# Patient Record
Sex: Female | Born: 1940 | Race: White | Hispanic: No | Smoking: Never smoker
Health system: Southern US, Community
[De-identification: ages and names within clinical notes are randomized; demographics above are authoritative.]

## PROBLEM LIST (undated history)

## (undated) DIAGNOSIS — I10 Essential (primary) hypertension: Secondary | ICD-10-CM

## (undated) DIAGNOSIS — M8580 Other specified disorders of bone density and structure, unspecified site: Secondary | ICD-10-CM

## (undated) DIAGNOSIS — E7800 Pure hypercholesterolemia, unspecified: Secondary | ICD-10-CM

## (undated) DIAGNOSIS — E559 Vitamin D deficiency, unspecified: Secondary | ICD-10-CM

## (undated) DIAGNOSIS — N189 Chronic kidney disease, unspecified: Secondary | ICD-10-CM

## (undated) DIAGNOSIS — M7030 Other bursitis of elbow, unspecified elbow: Secondary | ICD-10-CM

## (undated) DIAGNOSIS — M1990 Unspecified osteoarthritis, unspecified site: Secondary | ICD-10-CM

## (undated) DIAGNOSIS — M5126 Other intervertebral disc displacement, lumbar region: Secondary | ICD-10-CM

## (undated) HISTORY — PX: OTHER SURGICAL HISTORY: SHX169

## (undated) HISTORY — PX: APPENDECTOMY: SHX54

## (undated) HISTORY — PX: CHOLECYSTECTOMY: SHX55

## (undated) HISTORY — PX: ABDOMINAL HYSTERECTOMY: SHX81

## (undated) HISTORY — PX: FOOT SURGERY: SHX648

## (undated) HISTORY — PX: INCISE AND DRAIN ABCESS: PRO64

## (undated) HISTORY — PX: JOINT REPLACEMENT: SHX530

---

## 2009-12-01 ENCOUNTER — Emergency Department: Payer: Self-pay | Admitting: Emergency Medicine

## 2010-05-18 ENCOUNTER — Emergency Department: Payer: Self-pay | Admitting: Emergency Medicine

## 2010-05-20 ENCOUNTER — Inpatient Hospital Stay: Payer: Self-pay | Admitting: Orthopedic Surgery

## 2010-05-29 ENCOUNTER — Encounter: Payer: Self-pay | Admitting: Physician Assistant

## 2010-06-08 ENCOUNTER — Encounter: Payer: Self-pay | Admitting: Physician Assistant

## 2010-07-09 ENCOUNTER — Encounter: Payer: Self-pay | Admitting: Physician Assistant

## 2011-11-05 ENCOUNTER — Ambulatory Visit: Payer: Self-pay | Admitting: Podiatry

## 2013-10-11 ENCOUNTER — Ambulatory Visit: Payer: Self-pay | Admitting: Neurology

## 2015-02-15 ENCOUNTER — Encounter: Payer: Self-pay | Admitting: *Deleted

## 2015-02-18 ENCOUNTER — Ambulatory Visit
Admission: RE | Admit: 2015-02-18 | Discharge: 2015-02-18 | Disposition: A | Discharge status: Home / Self Care | Payer: Medicare Other | Source: Ambulatory Visit | Attending: Gastroenterology | Admitting: Gastroenterology

## 2015-02-18 ENCOUNTER — Ambulatory Visit: Payer: Medicare Other | Admitting: Anesthesiology

## 2015-02-18 ENCOUNTER — Encounter
Admission: RE | Disposition: A | Discharge status: Home / Self Care | Payer: Self-pay | Source: Ambulatory Visit | Attending: Gastroenterology

## 2015-02-18 ENCOUNTER — Encounter: Payer: Self-pay | Admitting: Anesthesiology

## 2015-02-18 DIAGNOSIS — I10 Essential (primary) hypertension: Secondary | ICD-10-CM | POA: Diagnosis not present

## 2015-02-18 DIAGNOSIS — Z1211 Encounter for screening for malignant neoplasm of colon: Secondary | ICD-10-CM | POA: Diagnosis not present

## 2015-02-18 DIAGNOSIS — K5730 Diverticulosis of large intestine without perforation or abscess without bleeding: Secondary | ICD-10-CM | POA: Insufficient documentation

## 2015-02-18 DIAGNOSIS — E559 Vitamin D deficiency, unspecified: Secondary | ICD-10-CM | POA: Diagnosis not present

## 2015-02-18 DIAGNOSIS — M8580 Other specified disorders of bone density and structure, unspecified site: Secondary | ICD-10-CM | POA: Diagnosis not present

## 2015-02-18 DIAGNOSIS — D123 Benign neoplasm of transverse colon: Secondary | ICD-10-CM | POA: Insufficient documentation

## 2015-02-18 DIAGNOSIS — K644 Residual hemorrhoidal skin tags: Secondary | ICD-10-CM | POA: Diagnosis not present

## 2015-02-18 DIAGNOSIS — E780 Pure hypercholesterolemia: Secondary | ICD-10-CM | POA: Diagnosis not present

## 2015-02-18 DIAGNOSIS — Z96641 Presence of right artificial hip joint: Secondary | ICD-10-CM | POA: Diagnosis not present

## 2015-02-18 HISTORY — DX: Vitamin D deficiency, unspecified: E55.9

## 2015-02-18 HISTORY — DX: Pure hypercholesterolemia, unspecified: E78.00

## 2015-02-18 HISTORY — DX: Other bursitis of elbow, unspecified elbow: M70.30

## 2015-02-18 HISTORY — DX: Essential (primary) hypertension: I10

## 2015-02-18 HISTORY — PX: COLONOSCOPY WITH PROPOFOL: SHX5780

## 2015-02-18 HISTORY — DX: Other specified disorders of bone density and structure, unspecified site: M85.80

## 2015-02-18 SURGERY — COLONOSCOPY WITH PROPOFOL
Anesthesia: General

## 2015-02-18 MED ORDER — PROPOFOL INFUSION 10 MG/ML OPTIME
INTRAVENOUS | Status: DC | PRN
  Administered 2015-02-18: 120 ug/kg/min via INTRAVENOUS

## 2015-02-18 MED ORDER — LIDOCAINE HCL (CARDIAC) 20 MG/ML IV SOLN
INTRAVENOUS | Status: DC | PRN
  Administered 2015-02-18: 100 mg via INTRAVENOUS

## 2015-02-18 MED ORDER — SODIUM CHLORIDE 0.9 % IV SOLN
INTRAVENOUS | Status: DC
  Administered 2015-02-18: 1000 mL via INTRAVENOUS

## 2015-02-18 MED ORDER — GLYCOPYRROLATE 0.2 MG/ML IJ SOLN
INTRAMUSCULAR | Status: DC | PRN
  Administered 2015-02-18 (×2): 0.2 mg via INTRAVENOUS

## 2015-02-18 MED ORDER — SODIUM CHLORIDE 0.9 % IV SOLN: INTRAVENOUS | Status: DC

## 2015-02-18 MED ORDER — PROPOFOL 10 MG/ML IV BOLUS
INTRAVENOUS | Status: DC | PRN
  Administered 2015-02-18: 60 mg via INTRAVENOUS

## 2015-02-18 MED ORDER — IPRATROPIUM-ALBUTEROL 0.5-2.5 (3) MG/3ML IN SOLN
RESPIRATORY_TRACT | Status: AC
  Filled 2015-02-18: qty 3

## 2015-02-18 NOTE — Anesthesia Preprocedure Evaluation (Signed)
Anesthesia Evaluation  Patient identified by MRN, date of birth, ID band Patient awake    Reviewed: Allergy & Precautions, H&P , NPO status , Patient's Chart, lab work & pertinent test results  Airway Mallampati: III  TM Distance: >3 FB Neck ROM: limited    Dental no notable dental hx. (+) Teeth Intact   Pulmonary neg pulmonary ROS,    Pulmonary exam normal breath sounds clear to auscultation       Cardiovascular Exercise Tolerance: Good hypertension, (-) Past MI Normal cardiovascular exam Rhythm:regular Rate:Normal     Neuro/Psych negative neurological ROS  negative psych ROS   GI/Hepatic negative GI ROS, Neg liver ROS, neg GERD  ,  Endo/Other  negative endocrine ROS  Renal/GU negative Renal ROS  negative genitourinary   Musculoskeletal   Abdominal   Peds  Hematology negative hematology ROS (+)   Anesthesia Other Findings Past Medical History:   Hypertension                                                 Epitrochlear bursitis                                        Osteopenia                                                   Vitamin D deficiency                                         Hypercholesterolemia                                         Reproductive/Obstetrics negative OB ROS                             Anesthesia Physical Anesthesia Plan  ASA: III  Anesthesia Plan: General   Post-op Pain Management:    Induction:   Airway Management Planned:   Additional Equipment:   Intra-op Plan:   Post-operative Plan:   Informed Consent: I have reviewed the patients History and Physical, chart, labs and discussed the procedure including the risks, benefits and alternatives for the proposed anesthesia with the patient or authorized representative who has indicated his/her understanding and acceptance.   Dental Advisory Given  Plan Discussed with: Anesthesiologist, CRNA and  Surgeon  Anesthesia Plan Comments:         Anesthesia Quick Evaluation

## 2015-02-18 NOTE — Anesthesia Postprocedure Evaluation (Signed)
  Anesthesia Post-op Note  Patient: Sabrina Schmidt  Procedure(s) Performed: Procedure(s): COLONOSCOPY WITH PROPOFOL (N/A)  Anesthesia type:General  Patient location: PACU  Post pain: Pain level controlled  Post assessment: Post-op Vital signs reviewed, Patient's Cardiovascular Status Stable, Respiratory Function Stable, Patent Airway and No signs of Nausea or vomiting  Post vital signs: Reviewed and stable  Last Vitals:  Filed Vitals:   02/18/15 1251  BP: 106/84  Pulse: 87  Temp:   Resp: 16    Level of consciousness: awake, alert  and patient cooperative  Complications: No apparent anesthesia complications

## 2015-02-18 NOTE — Discharge Instructions (Signed)

## 2015-02-18 NOTE — Op Note (Signed)
[facility] Gastroenterology Patient Name: Sabrina Schmidt Procedure Date: 02/18/2015 11:36 AM MRN: [id number] Account #: [id number] Date of Birth: 07/26/1940 Admit Type: Outpatient Age: 74 Room: [facility] ENDO ROOM 2 Gender: Female Note Status: Finalized Procedure:         Colonoscopy Indications:       Screening for colorectal malignant neoplasm, Last                     colonoscopy: 2008 Patient Profile:   This is a 74 year old female. Providers:         Gerrit Heck. Rayann Heman, MD Referring MD:      Dani Gobble. White, MD (Referring MD) Medicines:         Propofol per Anesthesia Complications:     No immediate complications. Procedure:         Pre-Anesthesia Assessment:                    - Prior to the procedure, a History and Physical was                     performed, and patient medications, allergies and                     sensitivities were reviewed. The patient's tolerance of                     previous anesthesia was reviewed.                    After obtaining informed consent, the colonoscope was                     passed under direct vision. Throughout the procedure, the                     patient's blood pressure, pulse, and oxygen saturations                     were monitored continuously. The Olympus PCF-H180AL                     colonoscope ( S#: [id number] ) was introduced through the                     anus and advanced to the the cecum, identified by                     appendiceal orifice and ileocecal valve. The colonoscopy                     was performed without difficulty. The patient tolerated                     the procedure well. The quality of the bowel preparation                     was excellent. Findings:      The perianal exam findings include non-thrombosed external hemorrhoids.      A 6 mm polyp was found in the mid transverse colon. The polyp was       sessile. The polyp was removed with a hot snare. Resection and retrieval   were complete.      Two sessile polyps were found in the mid transverse colon just proximal  to 6 mm polyp. The polyps were 2 to 3 mm in size. These polyps were       removed with a jumbo cold forceps. Resection and retrieval were complete.      Many small and large-mouthed diverticula were found in the sigmoid colon.      The exam was otherwise without abnormality on direct and retroflexion       views. Impression:        - Non-thrombosed external hemorrhoids found on perianal                     exam.                    - One 6 mm polyp in the mid transverse colon. Resected and                     retrieved.                    - Two 2 to 3 mm polyps in the mid transverse colon.                     Resected and retrieved.                    - Diverticulosis in the sigmoid colon.                    - The examination was otherwise normal on direct and                     retroflexion views. Recommendation:    - Observe patient in GI recovery unit.                    - Continue present medications.                    - Await pathology results.                    - Repeat colonoscopy for surveillance based on pathology                     results.                    - The findings and recommendations were discussed with the                     patient.                    - The findings and recommendations were discussed with the                     patient's family. Procedure Code(s): --- Professional ---                    [phone], Colonoscopy, flexible; with removal of tumor(s),                     polyp(s), or other lesion(s) by snare technique                    61607, 62, Colonoscopy, flexible; with biopsy, single or                     multiple CPT copyright  2014 American Medical Association. All rights reserved. The codes documented in this report are preliminary and upon coder review may  be revised to meet current compliance requirements. Mellody Life, MD 02/18/2015  12:20:10 PM This report has been signed electronically. Number of Addenda: 0 Note Initiated On: 02/18/2015 11:36 AM Scope Withdrawal Time: 0 hours 13 minutes 6 seconds  Total Procedure Duration: 0 hours 24 minutes 14 seconds       [facility]

## 2015-02-18 NOTE — Transfer of Care (Signed)
Immediate Anesthesia Transfer of Care Note  Patient: Sabrina Schmidt  Procedure(s) Performed: Procedure(s): COLONOSCOPY WITH PROPOFOL (N/A)  Patient Location: Endoscopy Unit  Anesthesia Type:General  Level of Consciousness: sedated  Airway & Oxygen Therapy: Patient Spontanous Breathing and Patient connected to nasal cannula oxygen  Post-op Assessment: Report given to RN and Post -op Vital signs reviewed and stable  Post vital signs: Reviewed and stable  Last Vitals:  Filed Vitals:   02/18/15 1107  BP: 101/57  Pulse: 70  Temp: 36 C  Resp: 16    Complications: No apparent anesthesia complications

## 2015-02-18 NOTE — H&P (Signed)
  Primary Care Physician:  WHITE, Orlene Och, NP  Pre-Procedure History & Physical: HPI:  Sabrina Schmidt is a 74 y.o. female is here for an colonoscopy.   Past Medical History  Diagnosis Date  . Hypertension   . Epitrochlear bursitis   . Osteopenia   . Vitamin D deficiency   . Hypercholesterolemia     Past Surgical History  Procedure Laterality Date  . Joint replacement    . Rt. hip replacement      Prior to Admission medications   Medication Sig Start Date End Date Taking? Authorizing Provider  hydrochlorothiazide (HYDRODIURIL) 25 MG tablet Take 25 mg by mouth daily.   Yes Historical Provider, MD  atorvastatin (LIPITOR) 40 MG tablet Take 40 mg by mouth daily.    Historical Provider, MD    Allergies as of 01/29/2015  . (Not on File)    History reviewed. No pertinent family history.  Social History   Social History  . Marital Status: Unknown    Spouse Name: N/A  . Number of Children: N/A  . Years of Education: N/A   Occupational History  . Not on file.   Social History Main Topics  . Smoking status: Never Smoker   . Smokeless tobacco: Never Used  . Alcohol Use: 0.6 oz/week    1 Shots of liquor per week  . Drug Use: No  . Sexual Activity: Not on file   Other Topics Concern  . Not on file   Social History Narrative     Physical Exam: BP 101/57 mmHg  Pulse 70  Temp(Src) 96.8 F (36 C) (Tympanic)  Resp 16  Ht 5' (1.524 m)  Wt 66.679 kg (147 lb)  BMI 28.71 kg/m2  SpO2 100% General:   Alert,  pleasant and cooperative in NAD Head:  Normocephalic and atraumatic. Neck:  Supple; no masses or thyromegaly. Lungs:  Clear throughout to auscultation.    Heart:  Regular rate and rhythm. Abdomen:  Soft, nontender and nondistended. Normal bowel sounds, without guarding, and without rebound.   Neurologic:  Alert and  oriented x4;  grossly normal neurologically.  Impression/Plan: Sabrina Schmidt is here for an colonoscopy to be performed for screening, avg  risk  Risks, benefits, limitations, and alternatives regarding  colonoscopy have been reviewed with the patient.  Questions have been answered.  All parties agreeable.   Josefine Class, MD  02/18/2015, 11:41 AM

## 2015-02-19 ENCOUNTER — Encounter: Payer: Self-pay | Admitting: Gastroenterology

## 2015-02-19 LAB — SURGICAL PATHOLOGY

## 2015-07-11 ENCOUNTER — Encounter: Payer: Self-pay | Admitting: *Deleted

## 2015-07-16 NOTE — Discharge Instructions (Signed)

## 2015-07-17 ENCOUNTER — Ambulatory Visit: Payer: Medicare Other | Admitting: Anesthesiology

## 2015-07-17 ENCOUNTER — Ambulatory Visit
Admission: RE | Admit: 2015-07-17 | Discharge: 2015-07-17 | Disposition: A | Discharge status: Home / Self Care | Payer: Medicare Other | Source: Ambulatory Visit | Attending: Ophthalmology | Admitting: Ophthalmology

## 2015-07-17 ENCOUNTER — Encounter
Admission: RE | Disposition: A | Discharge status: Home / Self Care | Payer: Self-pay | Source: Ambulatory Visit | Attending: Ophthalmology

## 2015-07-17 DIAGNOSIS — E7800 Pure hypercholesterolemia, unspecified: Secondary | ICD-10-CM | POA: Diagnosis not present

## 2015-07-17 DIAGNOSIS — I10 Essential (primary) hypertension: Secondary | ICD-10-CM | POA: Diagnosis not present

## 2015-07-17 DIAGNOSIS — M1991 Primary osteoarthritis, unspecified site: Secondary | ICD-10-CM | POA: Insufficient documentation

## 2015-07-17 DIAGNOSIS — M810 Age-related osteoporosis without current pathological fracture: Secondary | ICD-10-CM | POA: Diagnosis not present

## 2015-07-17 DIAGNOSIS — H9190 Unspecified hearing loss, unspecified ear: Secondary | ICD-10-CM | POA: Diagnosis not present

## 2015-07-17 DIAGNOSIS — K589 Irritable bowel syndrome without diarrhea: Secondary | ICD-10-CM | POA: Insufficient documentation

## 2015-07-17 DIAGNOSIS — Z79899 Other long term (current) drug therapy: Secondary | ICD-10-CM | POA: Insufficient documentation

## 2015-07-17 DIAGNOSIS — Z888 Allergy status to other drugs, medicaments and biological substances status: Secondary | ICD-10-CM | POA: Insufficient documentation

## 2015-07-17 DIAGNOSIS — K449 Diaphragmatic hernia without obstruction or gangrene: Secondary | ICD-10-CM | POA: Diagnosis not present

## 2015-07-17 DIAGNOSIS — H2512 Age-related nuclear cataract, left eye: Secondary | ICD-10-CM | POA: Insufficient documentation

## 2015-07-17 HISTORY — PX: CATARACT EXTRACTION W/PHACO: SHX586

## 2015-07-17 HISTORY — DX: Unspecified osteoarthritis, unspecified site: M19.90

## 2015-07-17 HISTORY — DX: Other intervertebral disc displacement, lumbar region: M51.26

## 2015-07-17 SURGERY — PHACOEMULSIFICATION, CATARACT, WITH IOL INSERTION
Anesthesia: Monitor Anesthesia Care | Laterality: Left | Wound class: Clean

## 2015-07-17 MED ORDER — TETRACAINE HCL 0.5 % OP SOLN
1.0000 [drp] | OPHTHALMIC | Status: DC | PRN
  Administered 2015-07-17: 1 [drp] via OPHTHALMIC

## 2015-07-17 MED ORDER — FENTANYL CITRATE (PF) 100 MCG/2ML IJ SOLN
INTRAMUSCULAR | Status: DC | PRN
  Administered 2015-07-17: 50 ug via INTRAVENOUS

## 2015-07-17 MED ORDER — POVIDONE-IODINE 5 % OP SOLN
1.0000 "application " | OPHTHALMIC | Status: DC | PRN
  Administered 2015-07-17: 1 via OPHTHALMIC

## 2015-07-17 MED ORDER — ARMC OPHTHALMIC DILATING GEL
1.0000 | OPHTHALMIC | Status: DC | PRN
Start: 2015-07-17 — End: 2015-07-17
  Administered 2015-07-17 (×2): 1 via OPHTHALMIC

## 2015-07-17 MED ORDER — MIDAZOLAM HCL 2 MG/2ML IJ SOLN
INTRAMUSCULAR | Status: DC | PRN
  Administered 2015-07-17: 2 mg via INTRAVENOUS

## 2015-07-17 MED ORDER — EPINEPHRINE HCL 1 MG/ML IJ SOLN
INTRAOCULAR | Status: DC | PRN
  Administered 2015-07-17: 58 mL via OPHTHALMIC

## 2015-07-17 MED ORDER — BRIMONIDINE TARTRATE 0.2 % OP SOLN
OPHTHALMIC | Status: DC | PRN
  Administered 2015-07-17: 1 [drp] via OPHTHALMIC

## 2015-07-17 MED ORDER — CEFUROXIME OPHTHALMIC INJECTION 1 MG/0.1 ML
INJECTION | OPHTHALMIC | Status: DC | PRN
  Administered 2015-07-17: 0.1 mL via OPHTHALMIC

## 2015-07-17 MED ORDER — ERYTHROMYCIN 5 MG/GM OP OINT
TOPICAL_OINTMENT | OPHTHALMIC | Status: DC | PRN
  Administered 2015-07-17: 1 via OPHTHALMIC

## 2015-07-17 MED ORDER — NA HYALUR & NA CHOND-NA HYALUR 0.4-0.35 ML IO KIT
PACK | INTRAOCULAR | Status: DC | PRN
  Administered 2015-07-17: 1 mL via INTRAOCULAR

## 2015-07-17 MED ORDER — TIMOLOL MALEATE 0.5 % OP SOLN
OPHTHALMIC | Status: DC | PRN
  Administered 2015-07-17: 1 [drp] via OPHTHALMIC

## 2015-07-17 SURGICAL SUPPLY — 29 items
CANNULA ANT/CHMB 27G (MISCELLANEOUS) ×1 IMPLANT
CANNULA ANT/CHMB 27GA (MISCELLANEOUS) ×3 IMPLANT
CARTRIDGE ABBOTT (MISCELLANEOUS) ×3 IMPLANT
GLOVE SURG LX 7.5 STRW (GLOVE) ×2
GLOVE SURG LX STRL 7.5 STRW (GLOVE) ×1 IMPLANT
GLOVE SURG TRIUMPH 8.0 PF LTX (GLOVE) ×3 IMPLANT
GOWN STRL REUS W/ TWL LRG LVL3 (GOWN DISPOSABLE) ×2 IMPLANT
GOWN STRL REUS W/TWL LRG LVL3 (GOWN DISPOSABLE) ×6
LENS IOL TECNIS 16.0 (Intraocular Lens) ×3 IMPLANT
LENS IOL TECNIS MONO 1P 16.0 (Intraocular Lens) IMPLANT
MARKER SKIN SURG W/RULER VIO (MISCELLANEOUS) ×3 IMPLANT
NDL FILTER BLUNT 18X1 1/2 (NEEDLE) ×1 IMPLANT
NDL RETROBULBAR .5 NSTRL (NEEDLE) IMPLANT
NEEDLE FILTER BLUNT 18X 1/2SAF (NEEDLE) ×2
NEEDLE FILTER BLUNT 18X1 1/2 (NEEDLE) ×1 IMPLANT
PACK CATARACT BRASINGTON (MISCELLANEOUS) ×3 IMPLANT
PACK EYE AFTER SURG (MISCELLANEOUS) ×3 IMPLANT
PACK OPTHALMIC (MISCELLANEOUS) ×3 IMPLANT
RING MALYGIN 7.0 (MISCELLANEOUS) IMPLANT
SUT ETHILON 10-0 CS-B-6CS-B-6 (SUTURE)
SUT VICRYL  9 0 (SUTURE)
SUT VICRYL 9 0 (SUTURE) IMPLANT
SUTURE EHLN 10-0 CS-B-6CS-B-6 (SUTURE) IMPLANT
SYR 3ML LL SCALE MARK (SYRINGE) ×3 IMPLANT
SYR 5ML LL (SYRINGE) IMPLANT
SYR TB 1ML LUER SLIP (SYRINGE) ×3 IMPLANT
WATER STERILE IRR 250ML POUR (IV SOLUTION) ×3 IMPLANT
WATER STERILE IRR 500ML POUR (IV SOLUTION) IMPLANT
WIPE NON LINTING 3.25X3.25 (MISCELLANEOUS) ×3 IMPLANT

## 2015-07-17 NOTE — Transfer of Care (Signed)
Immediate Anesthesia Transfer of Care Note  Patient: Sabrina Schmidt  Procedure(s) Performed: Procedure(s): CATARACT EXTRACTION PHACO AND INTRAOCULAR LENS PLACEMENT (IOC) (Left)  Patient Location: PACU  Anesthesia Type: MAC  Level of Consciousness: awake, alert  and patient cooperative  Airway and Oxygen Therapy: Patient Spontanous Breathing and Patient connected to supplemental oxygen  Post-op Assessment: Post-op Vital signs reviewed, Patient's Cardiovascular Status Stable, Respiratory Function Stable, Patent Airway and No signs of Nausea or vomiting  Post-op Vital Signs: Reviewed and stable  Complications: No apparent anesthesia complications

## 2015-07-17 NOTE — Anesthesia Procedure Notes (Signed)
Procedure Name: MAC Performed by: Evaristo Tsuda Pre-anesthesia Checklist: Patient identified, Emergency Drugs available, Suction available, Timeout performed and Patient being monitored Patient Re-evaluated:Patient Re-evaluated prior to inductionOxygen Delivery Method: Nasal cannula Placement Confirmation: positive ETCO2     

## 2015-07-17 NOTE — Op Note (Signed)
OPERATIVE NOTE  Sabrina Schmidt [id number] 07/17/2015   PREOPERATIVE DIAGNOSIS:  Nuclear sclerotic cataract left eye. H25.12   POSTOPERATIVE DIAGNOSIS:    Nuclear sclerotic cataract left eye.     PROCEDURE:  Phacoemusification with posterior chamber intraocular lens placement of the left eye   LENS:   Implant Name Type Inv. Item Serial No. Manufacturer Lot No. LRB No. Used  LENS IMPL INTRAOC ZCB00 16.0 - [id number] Intraocular Lens LENS IMPL INTRAOC ZCB00 16.0 [id number]   Left 1        ULTRASOUND TIME: 13  % of 0 minutes 60 seconds, CDE 8.1  SURGEON:  [name], MD   ANESTHESIA:  Topical with tetracaine drops and 2% Xylocaine jelly.   COMPLICATIONS:  None.   DESCRIPTION OF PROCEDURE:  The patient was identified in the holding room and transported to the operating room and placed in the supine position under the operating microscope.  The left eye was identified as the operative eye and it was prepped and draped in the usual sterile ophthalmic fashion.   A 1 millimeter clear-corneal paracentesis was made at the 1:30 position.  The anterior chamber was filled with Viscoat viscoelastic.  A 2.4 millimeter keratome was used to make a near-clear corneal incision at the 10:30 position.  .  A curvilinear capsulorrhexis was made with a cystotome and capsulorrhexis forceps.  Balanced salt solution was used to hydrodissect and hydrodelineate the nucleus.   Phacoemulsification was then used in stop and chop fashion to remove the lens nucleus and epinucleus.  The remaining cortex was then removed using the irrigation and aspiration handpiece. Provisc was then placed into the capsular bag to distend it for lens placement.  A lens was then injected into the capsular bag.  The remaining viscoelastic was aspirated.   Wounds were hydrated with balanced salt solution.  The anterior chamber was inflated to a physiologic pressure with balanced salt solution.  No wound leaks were noted.  Cefuroxime 0.1 ml of a 10mg /ml solution was injected into the anterior chamber for a dose of 1 mg of intracameral antibiotic at the completion of the case.   Timolol and Brimonidine drops were applied to the eye.  The patient was taken to the recovery room in stable condition without complications of anesthesia or surgery.  [name] 07/17/2015, 1:16 PM

## 2015-07-17 NOTE — Anesthesia Postprocedure Evaluation (Signed)
Anesthesia Post Note  Patient: Sabrina Schmidt  Procedure(s) Performed: Procedure(s) (LRB): CATARACT EXTRACTION PHACO AND INTRAOCULAR LENS PLACEMENT (IOC) (Left)  Patient location during evaluation: PACU Anesthesia Type: MAC Level of consciousness: awake and alert Pain management: pain level controlled Vital Signs Assessment: post-procedure vital signs reviewed and stable Respiratory status: spontaneous breathing, nonlabored ventilation and respiratory function stable Cardiovascular status: blood pressure returned to baseline and stable Postop Assessment: no signs of nausea or vomiting Anesthetic complications: no    Marcquis Ridlon D Jacquelin Krajewski

## 2015-07-17 NOTE — Anesthesia Preprocedure Evaluation (Signed)
Anesthesia Evaluation  Patient identified by MRN, date of birth, ID band Patient awake    Reviewed: Allergy & Precautions, H&P , NPO status , Patient's Chart, lab work & pertinent test results, reviewed documented beta blocker date and time   Airway Mallampati: II  TM Distance: >3 FB Neck ROM: full    Dental no notable dental hx.    Pulmonary neg pulmonary ROS,    Pulmonary exam normal breath sounds clear to auscultation       Cardiovascular Exercise Tolerance: Good hypertension,  Rhythm:regular Rate:Normal     Neuro/Psych negative neurological ROS  negative psych ROS   GI/Hepatic negative GI ROS, Neg liver ROS,   Endo/Other  negative endocrine ROS  Renal/GU negative Renal ROS  negative genitourinary   Musculoskeletal   Abdominal   Peds  Hematology negative hematology ROS (+)   Anesthesia Other Findings   Reproductive/Obstetrics negative OB ROS                             Anesthesia Physical Anesthesia Plan  ASA: II  Anesthesia Plan: MAC   Post-op Pain Management:    Induction:   Airway Management Planned:   Additional Equipment:   Intra-op Plan:   Post-operative Plan:   Informed Consent: I have reviewed the patients History and Physical, chart, labs and discussed the procedure including the risks, benefits and alternatives for the proposed anesthesia with the patient or authorized representative who has indicated his/her understanding and acceptance.     Plan Discussed with: CRNA  Anesthesia Plan Comments:         Anesthesia Quick Evaluation

## 2015-07-17 NOTE — H&P (Signed)
  The History and Physical notes are on paper, have been signed, and are to be scanned. The patient remains stable and unchanged from the H&P.   Previous H&P reviewed, patient examined, and there are no changes.  Hillman Attig 07/17/2015 12:21 PM

## 2015-07-18 ENCOUNTER — Encounter: Payer: Self-pay | Admitting: Ophthalmology

## 2015-08-21 ENCOUNTER — Encounter: Payer: Self-pay | Admitting: *Deleted

## 2015-08-22 NOTE — Discharge Instructions (Signed)

## 2015-08-22 NOTE — Anesthesia Preprocedure Evaluation (Addendum)
Anesthesia Evaluation  Patient identified by MRN, date of birth, ID band Patient awake    Airway Mallampati: II  TM Distance: >3 FB Neck ROM: Full    Dental   Pulmonary    Pulmonary exam normal        Cardiovascular hypertension, Pt. on medications Normal cardiovascular exam     Neuro/Psych    GI/Hepatic   Endo/Other    Renal/GU      Musculoskeletal   Abdominal   Peds  Hematology   Anesthesia Other Findings   Reproductive/Obstetrics                            Anesthesia Physical Anesthesia Plan  ASA: II  Anesthesia Plan: MAC   Post-op Pain Management:    Induction: Intravenous  Airway Management Planned:   Additional Equipment:   Intra-op Plan:   Post-operative Plan:   Informed Consent: I have reviewed the patients History and Physical, chart, labs and discussed the procedure including the risks, benefits and alternatives for the proposed anesthesia with the patient or authorized representative who has indicated his/her understanding and acceptance.     Plan Discussed with: CRNA  Anesthesia Plan Comments:         Anesthesia Quick Evaluation                                  Anesthesia Evaluation  Patient identified by MRN, date of birth, ID band Patient awake    Reviewed: Allergy & Precautions, H&P , NPO status , Patient's Chart, lab work & pertinent test results, reviewed documented beta blocker date and time   Airway Mallampati: II  TM Distance: >3 FB Neck ROM: full    Dental no notable dental hx.    Pulmonary neg pulmonary ROS,    Pulmonary exam normal breath sounds clear to auscultation       Cardiovascular Exercise Tolerance: Good hypertension,  Rhythm:regular Rate:Normal     Neuro/Psych negative neurological ROS  negative psych ROS   GI/Hepatic negative GI ROS, Neg liver ROS,   Endo/Other  negative endocrine ROS   Renal/GU negative Renal ROS  negative genitourinary   Musculoskeletal   Abdominal   Peds  Hematology negative hematology ROS (+)   Anesthesia Other Findings   Reproductive/Obstetrics negative OB ROS                             Anesthesia Physical Anesthesia Plan  ASA: II  Anesthesia Plan: MAC   Post-op Pain Management:    Induction:   Airway Management Planned:   Additional Equipment:   Intra-op Plan:   Post-operative Plan:   Informed Consent: I have reviewed the patients History and Physical, chart, labs and discussed the procedure including the risks, benefits and alternatives for the proposed anesthesia with the patient or authorized representative who has indicated his/her understanding and acceptance.     Plan Discussed with: CRNA  Anesthesia Plan Comments:         Anesthesia Quick Evaluation                                   Anesthesia Evaluation  Patient identified by MRN, date of birth, ID band Patient awake    Reviewed: Allergy & Precautions, H&P , NPO status ,  Patient's Chart, lab work & pertinent test results, reviewed documented beta blocker date and time   Airway Mallampati: II  TM Distance: >3 FB Neck ROM: full    Dental no notable dental hx.    Pulmonary neg pulmonary ROS,    Pulmonary exam normal breath sounds clear to auscultation       Cardiovascular Exercise Tolerance: Good hypertension,  Rhythm:regular Rate:Normal     Neuro/Psych negative neurological ROS  negative psych ROS   GI/Hepatic negative GI ROS, Neg liver ROS,   Endo/Other  negative endocrine ROS  Renal/GU negative Renal ROS  negative genitourinary   Musculoskeletal   Abdominal   Peds  Hematology negative hematology ROS (+)   Anesthesia Other Findings   Reproductive/Obstetrics negative OB ROS                             Anesthesia Physical Anesthesia Plan  ASA: II  Anesthesia  Plan: MAC   Post-op Pain Management:    Induction:   Airway Management Planned:   Additional Equipment:   Intra-op Plan:   Post-operative Plan:   Informed Consent: I have reviewed the patients History and Physical, chart, labs and discussed the procedure including the risks, benefits and alternatives for the proposed anesthesia with the patient or authorized representative who has indicated his/her understanding and acceptance.     Plan Discussed with: CRNA  Anesthesia Plan Comments:         Anesthesia Quick Evaluation

## 2015-08-28 ENCOUNTER — Encounter
Admission: RE | Disposition: A | Discharge status: Home / Self Care | Payer: Self-pay | Source: Ambulatory Visit | Attending: Ophthalmology

## 2015-08-28 ENCOUNTER — Ambulatory Visit
Admission: RE | Admit: 2015-08-28 | Discharge: 2015-08-28 | Disposition: A | Discharge status: Home / Self Care | Payer: Medicare Other | Source: Ambulatory Visit | Attending: Ophthalmology | Admitting: Ophthalmology

## 2015-08-28 ENCOUNTER — Ambulatory Visit: Payer: Medicare Other | Admitting: Anesthesiology

## 2015-08-28 DIAGNOSIS — E7800 Pure hypercholesterolemia, unspecified: Secondary | ICD-10-CM | POA: Diagnosis not present

## 2015-08-28 DIAGNOSIS — M1990 Unspecified osteoarthritis, unspecified site: Secondary | ICD-10-CM | POA: Insufficient documentation

## 2015-08-28 DIAGNOSIS — Z90710 Acquired absence of both cervix and uterus: Secondary | ICD-10-CM | POA: Insufficient documentation

## 2015-08-28 DIAGNOSIS — Z9049 Acquired absence of other specified parts of digestive tract: Secondary | ICD-10-CM | POA: Diagnosis not present

## 2015-08-28 DIAGNOSIS — I10 Essential (primary) hypertension: Secondary | ICD-10-CM | POA: Diagnosis not present

## 2015-08-28 DIAGNOSIS — M810 Age-related osteoporosis without current pathological fracture: Secondary | ICD-10-CM | POA: Insufficient documentation

## 2015-08-28 DIAGNOSIS — Z79899 Other long term (current) drug therapy: Secondary | ICD-10-CM | POA: Diagnosis not present

## 2015-08-28 DIAGNOSIS — H2511 Age-related nuclear cataract, right eye: Secondary | ICD-10-CM | POA: Diagnosis present

## 2015-08-28 HISTORY — PX: CATARACT EXTRACTION W/PHACO: SHX586

## 2015-08-28 SURGERY — PHACOEMULSIFICATION, CATARACT, WITH IOL INSERTION
Anesthesia: Monitor Anesthesia Care | Site: Eye | Laterality: Right | Wound class: Clean

## 2015-08-28 MED ORDER — NA HYALUR & NA CHOND-NA HYALUR 0.4-0.35 ML IO KIT
PACK | INTRAOCULAR | Status: DC | PRN
  Administered 2015-08-28: 1 mL via INTRAOCULAR

## 2015-08-28 MED ORDER — CEFUROXIME OPHTHALMIC INJECTION 1 MG/0.1 ML
INJECTION | OPHTHALMIC | Status: DC | PRN
  Administered 2015-08-28: 0.1 mL via INTRACAMERAL

## 2015-08-28 MED ORDER — MIDAZOLAM HCL 2 MG/2ML IJ SOLN
INTRAMUSCULAR | Status: DC | PRN
  Administered 2015-08-28: 1.5 mg via INTRAVENOUS

## 2015-08-28 MED ORDER — TIMOLOL MALEATE 0.5 % OP SOLN
OPHTHALMIC | Status: DC | PRN
  Administered 2015-08-28: 1 [drp] via OPHTHALMIC

## 2015-08-28 MED ORDER — LACTATED RINGERS IV SOLN: INTRAVENOUS | Status: DC

## 2015-08-28 MED ORDER — BRIMONIDINE TARTRATE 0.2 % OP SOLN
OPHTHALMIC | Status: DC | PRN
  Administered 2015-08-28: 1 [drp] via OPHTHALMIC

## 2015-08-28 MED ORDER — FENTANYL CITRATE (PF) 100 MCG/2ML IJ SOLN
INTRAMUSCULAR | Status: DC | PRN
  Administered 2015-08-28: 50 ug via INTRAVENOUS

## 2015-08-28 MED ORDER — POVIDONE-IODINE 5 % OP SOLN
1.0000 "application " | OPHTHALMIC | Status: DC | PRN
  Administered 2015-08-28: 1 via OPHTHALMIC

## 2015-08-28 MED ORDER — EPINEPHRINE HCL 1 MG/ML IJ SOLN
INTRAOCULAR | Status: DC | PRN
  Administered 2015-08-28: 64 mL via OPHTHALMIC
  Administered 2015-08-28: 10:00:00 via OPHTHALMIC

## 2015-08-28 MED ORDER — ARMC OPHTHALMIC DILATING GEL
1.0000 "application " | OPHTHALMIC | Status: DC | PRN
  Administered 2015-08-28 (×2): 1 via OPHTHALMIC

## 2015-08-28 MED ORDER — TETRACAINE HCL 0.5 % OP SOLN
1.0000 [drp] | OPHTHALMIC | Status: DC | PRN
  Administered 2015-08-28: 1 [drp] via OPHTHALMIC

## 2015-08-28 SURGICAL SUPPLY — 28 items
CANNULA ANT/CHMB 27G (MISCELLANEOUS) ×1 IMPLANT
CANNULA ANT/CHMB 27GA (MISCELLANEOUS) ×3 IMPLANT
CARTRIDGE ABBOTT (MISCELLANEOUS) ×3 IMPLANT
GLOVE SURG LX 7.5 STRW (GLOVE) ×2
GLOVE SURG LX STRL 7.5 STRW (GLOVE) ×1 IMPLANT
GLOVE SURG TRIUMPH 8.0 PF LTX (GLOVE) ×3 IMPLANT
GOWN STRL REUS W/ TWL LRG LVL3 (GOWN DISPOSABLE) ×2 IMPLANT
GOWN STRL REUS W/TWL LRG LVL3 (GOWN DISPOSABLE) ×6
LENS IOL TECNIS ITEC 17.5 (Intraocular Lens) ×2 IMPLANT
MARKER SKIN DUAL TIP RULER LAB (MISCELLANEOUS) ×3 IMPLANT
NDL FILTER BLUNT 18X1 1/2 (NEEDLE) ×1 IMPLANT
NDL RETROBULBAR .5 NSTRL (NEEDLE) IMPLANT
NEEDLE FILTER BLUNT 18X 1/2SAF (NEEDLE) ×2
NEEDLE FILTER BLUNT 18X1 1/2 (NEEDLE) ×1 IMPLANT
PACK CATARACT BRASINGTON (MISCELLANEOUS) ×3 IMPLANT
PACK EYE AFTER SURG (MISCELLANEOUS) ×3 IMPLANT
PACK OPTHALMIC (MISCELLANEOUS) ×3 IMPLANT
RING MALYGIN 7.0 (MISCELLANEOUS) IMPLANT
SUT ETHILON 10-0 CS-B-6CS-B-6 (SUTURE)
SUT VICRYL  9 0 (SUTURE)
SUT VICRYL 9 0 (SUTURE) IMPLANT
SUTURE EHLN 10-0 CS-B-6CS-B-6 (SUTURE) IMPLANT
SYR 3ML LL SCALE MARK (SYRINGE) ×3 IMPLANT
SYR 5ML LL (SYRINGE) IMPLANT
SYR TB 1ML LUER SLIP (SYRINGE) ×3 IMPLANT
WATER STERILE IRR 250ML POUR (IV SOLUTION) ×3 IMPLANT
WATER STERILE IRR 500ML POUR (IV SOLUTION) IMPLANT
WIPE NON LINTING 3.25X3.25 (MISCELLANEOUS) ×3 IMPLANT

## 2015-08-28 NOTE — Op Note (Signed)
LOCATION:  [place]   PREOPERATIVE DIAGNOSIS:    Nuclear sclerotic cataract right eye. H25.11   POSTOPERATIVE DIAGNOSIS:  Nuclear sclerotic cataract right eye.     PROCEDURE:  Phacoemusification with posterior chamber intraocular lens placement of the right eye   LENS:   Implant Name Type Inv. Item Serial No. Manufacturer Lot No. LRB No. Used  technis aspheric iol pre-loaded   [phone] [id number] [facility]  Right 1  technis aspheric iol pre-loaded     [phone] [facility]   Right 1     PCB00 17.5 D   ULTRASOUND TIME: 16.5 % of 1 minutes, 9 seconds.  CDE 11.5   SURGEON:  [name], MD   ANESTHESIA:  Topical with tetracaine drops and 2% Xylocaine jelly.   COMPLICATIONS:  None.   DESCRIPTION OF PROCEDURE:  The patient was identified in the holding room and transported to the operating room and placed in the supine position under the operating microscope.  The right eye was identified as the operative eye and it was prepped and draped in the usual sterile ophthalmic fashion.   A 1 millimeter clear-corneal paracentesis was made at the 12:00 position.  The anterior chamber was filled with Viscoat viscoelastic.  A 2.4 millimeter keratome was used to make a near-clear corneal incision at the 9:00 position.  A curvilinear capsulorrhexis was made with a cystotome and capsulorrhexis forceps.  Balanced salt solution was used to hydrodissect and hydrodelineate the nucleus.   Phacoemulsification was then used in stop and chop fashion to remove the lens nucleus and epinucleus.  The remaining cortex was then removed using the irrigation and aspiration handpiece. Provisc was then placed into the capsular bag to distend it for lens placement.  A lens was then injected into the capsular bag.  The remaining viscoelastic was aspirated.   Wounds were hydrated with balanced salt solution.  The anterior chamber was inflated to a physiologic pressure with balanced salt solution.  No wound  leaks were noted. Cefuroxime 0.1 ml of a 10mg /ml solution was injected into the anterior chamber for a dose of 1 mg of intracameral antibiotic at the completion of the case.   Timolol and Brimonidine drops were applied to the eye.  The patient was taken to the recovery room in stable condition without complications of anesthesia or surgery.   Sabrina Schmidt 08/28/2015, 9:42 AM

## 2015-08-28 NOTE — Transfer of Care (Signed)
Immediate Anesthesia Transfer of Care Note  Patient: Sabrina Schmidt  Procedure(s) Performed: Procedure(s): CATARACT EXTRACTION PHACO AND INTRAOCULAR LENS PLACEMENT (IOC) (Right)  Patient Location: PACU  Anesthesia Type: MAC  Level of Consciousness: awake, alert  and patient cooperative  Airway and Oxygen Therapy: Patient Spontanous Breathing and Patient connected to supplemental oxygen  Post-op Assessment: Post-op Vital signs reviewed, Patient's Cardiovascular Status Stable, Respiratory Function Stable, Patent Airway and No signs of Nausea or vomiting  Post-op Vital Signs: Reviewed and stable  Complications: No apparent anesthesia complications

## 2015-08-28 NOTE — Anesthesia Postprocedure Evaluation (Signed)
Anesthesia Post Note  Patient: Sabrina Schmidt  Procedure(s) Performed: Procedure(s) (LRB): CATARACT EXTRACTION PHACO AND INTRAOCULAR LENS PLACEMENT (IOC) (Right)  Patient location during evaluation: PACU Anesthesia Type: General Level of consciousness: awake and alert Pain management: pain level controlled Vital Signs Assessment: post-procedure vital signs reviewed and stable Respiratory status: spontaneous breathing, nonlabored ventilation, respiratory function stable and patient connected to nasal cannula oxygen Cardiovascular status: blood pressure returned to baseline and stable Postop Assessment: no signs of nausea or vomiting Anesthetic complications: no    Marshell Levan

## 2015-08-28 NOTE — H&P (Signed)
  The History and Physical notes are on paper, have been signed, and are to be scanned. The patient remains stable and unchanged from the H&P.   Previous H&P reviewed, patient examined, and there are no changes.  Lianne Carreto 08/28/2015 8:49 AM

## 2015-08-28 NOTE — Anesthesia Procedure Notes (Signed)
Procedure Name: MAC Performed by: Lisa-Marie Rueger Pre-anesthesia Checklist: Patient identified, Emergency Drugs available, Suction available, Timeout performed and Patient being monitored Patient Re-evaluated:Patient Re-evaluated prior to inductionOxygen Delivery Method: Nasal cannula Placement Confirmation: positive ETCO2       

## 2015-08-29 ENCOUNTER — Encounter: Payer: Self-pay | Admitting: Ophthalmology

## 2016-07-09 ENCOUNTER — Inpatient Hospital Stay: Payer: Medicare Other

## 2016-07-09 ENCOUNTER — Emergency Department: Payer: Medicare Other

## 2016-07-09 ENCOUNTER — Inpatient Hospital Stay
Admission: EM | Admit: 2016-07-09 | Discharge: 2016-07-12 | DRG: 871 | Disposition: A | Discharge status: Home Health | Payer: Medicare Other | Attending: Internal Medicine | Admitting: Internal Medicine

## 2016-07-09 DIAGNOSIS — Z820 Family history of epilepsy and other diseases of the nervous system: Secondary | ICD-10-CM | POA: Diagnosis not present

## 2016-07-09 DIAGNOSIS — G8929 Other chronic pain: Secondary | ICD-10-CM | POA: Diagnosis present

## 2016-07-09 DIAGNOSIS — M6281 Muscle weakness (generalized): Secondary | ICD-10-CM

## 2016-07-09 DIAGNOSIS — I10 Essential (primary) hypertension: Secondary | ICD-10-CM | POA: Diagnosis present

## 2016-07-09 DIAGNOSIS — J189 Pneumonia, unspecified organism: Secondary | ICD-10-CM | POA: Diagnosis present

## 2016-07-09 DIAGNOSIS — K219 Gastro-esophageal reflux disease without esophagitis: Secondary | ICD-10-CM | POA: Diagnosis present

## 2016-07-09 DIAGNOSIS — N179 Acute kidney failure, unspecified: Secondary | ICD-10-CM

## 2016-07-09 DIAGNOSIS — Z96641 Presence of right artificial hip joint: Secondary | ICD-10-CM | POA: Diagnosis present

## 2016-07-09 DIAGNOSIS — M810 Age-related osteoporosis without current pathological fracture: Secondary | ICD-10-CM | POA: Diagnosis present

## 2016-07-09 DIAGNOSIS — R262 Difficulty in walking, not elsewhere classified: Secondary | ICD-10-CM

## 2016-07-09 DIAGNOSIS — M545 Low back pain: Secondary | ICD-10-CM | POA: Diagnosis present

## 2016-07-09 DIAGNOSIS — R0902 Hypoxemia: Secondary | ICD-10-CM | POA: Diagnosis present

## 2016-07-09 DIAGNOSIS — Z79899 Other long term (current) drug therapy: Secondary | ICD-10-CM

## 2016-07-09 DIAGNOSIS — Z23 Encounter for immunization: Secondary | ICD-10-CM | POA: Diagnosis present

## 2016-07-09 DIAGNOSIS — E785 Hyperlipidemia, unspecified: Secondary | ICD-10-CM | POA: Diagnosis present

## 2016-07-09 DIAGNOSIS — Z803 Family history of malignant neoplasm of breast: Secondary | ICD-10-CM

## 2016-07-09 DIAGNOSIS — E876 Hypokalemia: Secondary | ICD-10-CM | POA: Diagnosis present

## 2016-07-09 DIAGNOSIS — R4781 Slurred speech: Secondary | ICD-10-CM

## 2016-07-09 DIAGNOSIS — N170 Acute kidney failure with tubular necrosis: Secondary | ICD-10-CM | POA: Diagnosis present

## 2016-07-09 DIAGNOSIS — W19XXXA Unspecified fall, initial encounter: Secondary | ICD-10-CM | POA: Diagnosis not present

## 2016-07-09 DIAGNOSIS — R6521 Severe sepsis with septic shock: Secondary | ICD-10-CM | POA: Diagnosis present

## 2016-07-09 DIAGNOSIS — M8580 Other specified disorders of bone density and structure, unspecified site: Secondary | ICD-10-CM | POA: Diagnosis present

## 2016-07-09 DIAGNOSIS — J9690 Respiratory failure, unspecified, unspecified whether with hypoxia or hypercapnia: Secondary | ICD-10-CM

## 2016-07-09 DIAGNOSIS — I9589 Other hypotension: Secondary | ICD-10-CM | POA: Diagnosis not present

## 2016-07-09 DIAGNOSIS — A419 Sepsis, unspecified organism: Secondary | ICD-10-CM | POA: Diagnosis present

## 2016-07-09 DIAGNOSIS — J181 Lobar pneumonia, unspecified organism: Secondary | ICD-10-CM | POA: Diagnosis not present

## 2016-07-09 DIAGNOSIS — E7800 Pure hypercholesterolemia, unspecified: Secondary | ICD-10-CM | POA: Diagnosis present

## 2016-07-09 DIAGNOSIS — I959 Hypotension, unspecified: Secondary | ICD-10-CM | POA: Diagnosis not present

## 2016-07-09 LAB — BASIC METABOLIC PANEL
ANION GAP: 7 (ref 5–15)
Anion gap: 13 (ref 5–15)
BUN: 30 mg/dL — AB (ref 6–20)
BUN: 30 mg/dL — ABNORMAL HIGH (ref 6–20)
CALCIUM: 7.4 mg/dL — AB (ref 8.9–10.3)
CHLORIDE: 94 mmol/L — AB (ref 101–111)
CO2: 25 mmol/L (ref 22–32)
CO2: 24 mmol/L (ref 22–32)
CREATININE: 1.67 mg/dL — AB (ref 0.44–1.00)
CREATININE: 1.7 mg/dL — AB (ref 0.44–1.00)
Calcium: 8.5 mg/dL — ABNORMAL LOW (ref 8.9–10.3)
Chloride: 106 mmol/L (ref 101–111)
GFR calc Af Amer: 33 mL/min — ABNORMAL LOW (ref >60)
GFR calc non Af Amer: 29 mL/min — ABNORMAL LOW (ref >60)
GFR, EST AFRICAN AMERICAN: 33 mL/min — AB (ref >60)
GFR, EST NON AFRICAN AMERICAN: 28 mL/min — AB (ref >60)
GLUCOSE: 161 mg/dL — AB (ref 65–99)
Glucose, Bld: 139 mg/dL — ABNORMAL HIGH (ref 65–99)
POTASSIUM: 2.1 mmol/L — AB (ref 3.5–5.1)
Potassium: 3.5 mmol/L (ref 3.5–5.1)
SODIUM: 137 mmol/L (ref 135–145)
Sodium: 132 mmol/L — ABNORMAL LOW (ref 135–145)

## 2016-07-09 LAB — CBC WITH DIFFERENTIAL/PLATELET
Basophils Absolute: 0 10*3/uL (ref 0–0.1)
Basophils Relative: 0 %
EOS ABS: 0 10*3/uL (ref 0–0.7)
Eosinophils Relative: 0 %
HEMATOCRIT: 39.7 % (ref 35.0–47.0)
Hemoglobin: 14.1 g/dL (ref 12.0–16.0)
LYMPHS ABS: 0.2 10*3/uL — AB (ref 1.0–3.6)
LYMPHS PCT: 3 %
MCH: 30.4 pg (ref 26.0–34.0)
MCHC: 35.6 g/dL (ref 32.0–36.0)
MCV: 85.5 fL (ref 80.0–100.0)
MONOS PCT: 4 %
Monocytes Absolute: 0.4 10*3/uL (ref 0.2–0.9)
NEUTROS PCT: 93 %
Neutro Abs: 9 10*3/uL — ABNORMAL HIGH (ref 1.4–6.5)
Platelets: 145 10*3/uL — ABNORMAL LOW (ref 150–440)
RBC: 4.64 MIL/uL (ref 3.80–5.20)
RDW: 12.8 % (ref 11.5–14.5)
WBC: 9.7 10*3/uL (ref 3.6–11.0)

## 2016-07-09 LAB — LACTIC ACID, PLASMA
LACTIC ACID, VENOUS: 1.8 mmol/L (ref 0.5–1.9)
Lactic Acid, Venous: 2.7 mmol/L (ref 0.5–1.9)
Lactic Acid, Venous: 2.4 mmol/L (ref 0.5–1.9)

## 2016-07-09 LAB — PROCALCITONIN: PROCALCITONIN: 17.75 ng/mL

## 2016-07-09 LAB — MAGNESIUM: Magnesium: 1.6 mg/dL — ABNORMAL LOW (ref 1.7–2.4)

## 2016-07-09 LAB — INFLUENZA PANEL BY PCR (TYPE A & B)
Influenza A By PCR: NEGATIVE
Influenza B By PCR: NEGATIVE

## 2016-07-09 MED ORDER — SODIUM CHLORIDE 0.9 % IV SOLN: INTRAVENOUS | Status: DC

## 2016-07-09 MED ORDER — METAXALONE 400 MG HALF TABLET
400.0000 mg | ORAL_TABLET | Freq: Three times a day (TID) | ORAL | Status: AC
  Administered 2016-07-09 – 2016-07-11 (×5): 400 mg via ORAL
  Filled 2016-07-09 (×8): qty 1

## 2016-07-09 MED ORDER — AZITHROMYCIN 500 MG PO TABS
500.0000 mg | ORAL_TABLET | Freq: Once | ORAL | Status: AC
  Administered 2016-07-09: 500 mg via ORAL
  Filled 2016-07-09: qty 1

## 2016-07-09 MED ORDER — ONDANSETRON HCL 4 MG/2ML IJ SOLN: 4.0000 mg | Freq: Four times a day (QID) | INTRAMUSCULAR | Status: DC | PRN

## 2016-07-09 MED ORDER — ONDANSETRON HCL 4 MG PO TABS: 4.0000 mg | ORAL_TABLET | Freq: Four times a day (QID) | ORAL | Status: DC | PRN

## 2016-07-09 MED ORDER — ACETAMINOPHEN 325 MG PO TABS: 650.0000 mg | ORAL_TABLET | Freq: Four times a day (QID) | ORAL | Status: DC | PRN

## 2016-07-09 MED ORDER — ACETAMINOPHEN 650 MG RE SUPP: 650.0000 mg | Freq: Four times a day (QID) | RECTAL | Status: DC | PRN

## 2016-07-09 MED ORDER — LORATADINE 10 MG PO TABS: 10.0000 mg | ORAL_TABLET | Freq: Every day | ORAL | Status: DC | PRN

## 2016-07-09 MED ORDER — KETOROLAC TROMETHAMINE 15 MG/ML IJ SOLN
15.0000 mg | Freq: Four times a day (QID) | INTRAMUSCULAR | Status: DC | PRN
  Administered 2016-07-09: 15 mg via INTRAVENOUS
  Filled 2016-07-09: qty 1

## 2016-07-09 MED ORDER — ATORVASTATIN CALCIUM 20 MG PO TABS
40.0000 mg | ORAL_TABLET | Freq: Every day | ORAL | Status: DC
  Administered 2016-07-09 – 2016-07-12 (×4): 40 mg via ORAL
  Filled 2016-07-09 (×4): qty 2

## 2016-07-09 MED ORDER — POLYETHYLENE GLYCOL 3350 17 G PO PACK: 17.0000 g | PACK | Freq: Every day | ORAL | Status: DC | PRN

## 2016-07-09 MED ORDER — DEXTROSE 5 % IV SOLN
500.0000 mg | INTRAVENOUS | Status: DC
  Administered 2016-07-10 – 2016-07-11 (×2): 500 mg via INTRAVENOUS
  Filled 2016-07-09 (×2): qty 500

## 2016-07-09 MED ORDER — SODIUM CHLORIDE 0.9 % IV SOLN
30.0000 meq | Freq: Once | INTRAVENOUS | Status: AC
  Administered 2016-07-09: 30 meq via INTRAVENOUS
  Filled 2016-07-09: qty 15

## 2016-07-09 MED ORDER — POTASSIUM CHLORIDE CRYS ER 20 MEQ PO TBCR: 40.0000 meq | EXTENDED_RELEASE_TABLET | ORAL | Status: DC

## 2016-07-09 MED ORDER — POTASSIUM CHLORIDE CRYS ER 20 MEQ PO TBCR
40.0000 meq | EXTENDED_RELEASE_TABLET | Freq: Once | ORAL | Status: AC
  Administered 2016-07-09: 40 meq via ORAL
  Filled 2016-07-09: qty 2

## 2016-07-09 MED ORDER — SODIUM CHLORIDE 0.9 % IV BOLUS (SEPSIS)
1000.0000 mL | Freq: Once | INTRAVENOUS | Status: AC
  Administered 2016-07-09: 1000 mL via INTRAVENOUS

## 2016-07-09 MED ORDER — SODIUM CHLORIDE 0.9 % IV SOLN
Freq: Once | INTRAVENOUS | Status: DC
  Administered 2016-07-09: 15:00:00 via INTRAVENOUS
  Filled 2016-07-09: qty 1000

## 2016-07-09 MED ORDER — MAGNESIUM SULFATE 2 GM/50ML IV SOLN
2.0000 g | Freq: Once | INTRAVENOUS | Status: AC
  Administered 2016-07-09: 2 g via INTRAVENOUS
  Filled 2016-07-09: qty 50

## 2016-07-09 MED ORDER — ONDANSETRON HCL 4 MG/2ML IJ SOLN
4.0000 mg | Freq: Once | INTRAMUSCULAR | Status: AC
  Administered 2016-07-09: 4 mg via INTRAVENOUS
  Filled 2016-07-09: qty 2

## 2016-07-09 MED ORDER — SODIUM CHLORIDE 0.9 % IV BOLUS (SEPSIS)
500.0000 mL | Freq: Once | INTRAVENOUS | Status: AC
  Administered 2016-07-09: 500 mL via INTRAVENOUS

## 2016-07-09 MED ORDER — DEXTROSE 5 % IV SOLN: 1.0000 g | Freq: Once | INTRAVENOUS | Status: DC

## 2016-07-09 MED ORDER — DOCUSATE SODIUM 100 MG PO CAPS
100.0000 mg | ORAL_CAPSULE | Freq: Two times a day (BID) | ORAL | Status: DC
  Administered 2016-07-09 – 2016-07-12 (×5): 100 mg via ORAL
  Filled 2016-07-09 (×5): qty 1

## 2016-07-09 MED ORDER — CEFTRIAXONE SODIUM-DEXTROSE 1-3.74 GM-% IV SOLR
1.0000 g | Freq: Once | INTRAVENOUS | Status: AC
  Administered 2016-07-09: 1 g via INTRAVENOUS
  Filled 2016-07-09: qty 50

## 2016-07-09 MED ORDER — TRAMADOL HCL 50 MG PO TABS
50.0000 mg | ORAL_TABLET | Freq: Four times a day (QID) | ORAL | Status: DC | PRN
  Administered 2016-07-09 – 2016-07-11 (×2): 50 mg via ORAL
  Filled 2016-07-09 (×2): qty 1

## 2016-07-09 MED ORDER — POTASSIUM CHLORIDE IN NACL 20-0.9 MEQ/L-% IV SOLN
INTRAVENOUS | Status: DC
  Administered 2016-07-09 – 2016-07-11 (×4): via INTRAVENOUS
  Filled 2016-07-09 (×7): qty 1000

## 2016-07-09 MED ORDER — BENZONATATE 100 MG PO CAPS
100.0000 mg | ORAL_CAPSULE | Freq: Three times a day (TID) | ORAL | Status: AC
  Administered 2016-07-09 – 2016-07-12 (×9): 100 mg via ORAL
  Filled 2016-07-09 (×10): qty 1

## 2016-07-09 MED ORDER — CEFTRIAXONE SODIUM-DEXTROSE 1-3.74 GM-% IV SOLR
1.0000 g | INTRAVENOUS | Status: DC
  Administered 2016-07-10 – 2016-07-11 (×2): 1 g via INTRAVENOUS
  Filled 2016-07-09 (×2): qty 50

## 2016-07-09 MED ORDER — ENOXAPARIN SODIUM 30 MG/0.3ML ~~LOC~~ SOLN
30.0000 mg | SUBCUTANEOUS | Status: DC
Start: 2016-07-09 — End: 2016-07-10
  Administered 2016-07-09: 30 mg via SUBCUTANEOUS
  Filled 2016-07-09: qty 0.3

## 2016-07-09 MED ORDER — HYDROCOD POLST-CPM POLST ER 10-8 MG/5ML PO SUER
5.0000 mL | Freq: Two times a day (BID) | ORAL | Status: DC
  Administered 2016-07-09 – 2016-07-12 (×6): 5 mL via ORAL
  Filled 2016-07-09 (×6): qty 5

## 2016-07-09 NOTE — Progress Notes (Signed)
Anticoagulation monitoring(Lovenox):  76 yo female ordered Lovenox 40 mg Q24h  Filed Weights   07/09/16 1302  Weight: 145 lb (65.8 kg)   BMI    Lab Results  Component Value Date   CREATININE 1.67 (H) 07/09/2016   Estimated Creatinine Clearance: 24.6 mL/min (by C-G formula based on SCr of 1.67 mg/dL (H)). Hemoglobin & Hematocrit     Component Value Date/Time   HGB 14.1 07/09/2016 1323   HCT 39.7 07/09/2016 1323     Per Protocol for Patient with estCrcl < 30 ml/min and BMI < 40, will transition to Lovenox 30 mg Q24h.

## 2016-07-09 NOTE — ED Triage Notes (Signed)
Pt comes into the ED via EMS from home with c/o productive cough/congestion for the past 3 weeks, N/V/D for the past 2 days, states she fell yesterday from generalized weakness.Marland Kitchen Pt FS 375, not dx with DM

## 2016-07-09 NOTE — ED Provider Notes (Signed)
[facility] Emergency Department Provider Note  ____________________________________________  Time seen: Approximately 1:07 PM  I have reviewed the triage vital signs and the nursing notes.   HISTORY  Chief Complaint Weakness and Cough   HPI Aletha Edmonson is a 76 y.o. female the history of osteopenia, hypertension hyperlipidemia who presents for evaluation of flulike symptoms. Patient reports two weeks of cough productive of brown sputum and congestion. Patient reports feeling worse for the past 2 days. Now has had nausea, 2 episodes of nonbloody nonbilious emesis, and a few episodes of watery diarrhea. No fever or chills, no body aches, no sore throat, no headache, no rash, no abdominal pain, no dysuria or hematuria, no CP. Has had SOB. Patient reports that she has been taking over-the-counter medication for allergies and coughing. She reports that she feels extremely weak. Yesterday she had a fall due to her generalized weakness. Has had d decreased by mouth intake. She denies head trauma or LOC with her fall yesterday. She is complaining of low back pain which is chronic for her and unchanged.   Past Medical History:  Diagnosis Date  . Arthritis    knees, back  . Epitrochlear bursitis   . Hypercholesterolemia   . Hypertension   . 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 Osteopenia   . Vitamin D deficiency     Patient Active Problem List   Diagnosis Date Noted  . Sepsis ([place]) 07/09/2016    Past Surgical History:  Procedure Laterality Date  . ABDOMINAL HYSTERECTOMY     age 89  . CATARACT EXTRACTION W/PHACO Left 07/17/2015   Procedure: CATARACT EXTRACTION PHACO AND INTRAOCULAR LENS PLACEMENT (IOC);  Surgeon: Leandrew Koyanagi, MD;  Location: [facility];  Service: Ophthalmology;  Laterality: Left;  . CATARACT EXTRACTION W/PHACO Right 08/28/2015   Procedure: CATARACT EXTRACTION PHACO AND INTRAOCULAR LENS PLACEMENT (IOC);  Surgeon: Leandrew Koyanagi, MD;  Location: [facility];  Service: Ophthalmology;  Laterality: Right;  . COLONOSCOPY WITH PROPOFOL N/A 02/18/2015   Procedure: COLONOSCOPY WITH PROPOFOL;  Surgeon: Josefine Class, MD;  Location: [facility] ENDOSCOPY;  Service: Endoscopy;  Laterality: N/A;  . FOOT SURGERY     Dr Elvina Mattes, [facility]  . INCISE AND DRAIN ABCESS Left    hand  . JOINT REPLACEMENT    . Rt. hip replacement      Prior to Admission medications   Medication Sig Start Date End Date Taking? Authorizing Provider  atorvastatin (LIPITOR) 40 MG tablet Take 40 mg by mouth daily.   Yes Historical Provider, MD  cetirizine (ZYRTEC) 10 MG tablet Take 10 mg by mouth daily as needed for allergies.   Yes Historical Provider, MD  hydrochlorothiazide (HYDRODIURIL) 25 MG tablet Take 25 mg by mouth daily as needed.    Yes Historical Provider, MD    Allergies Ivp dye [iodinated diagnostic agents]  Family History  Problem Relation Age of Onset  . Alzheimer's disease Mother   . Breast cancer Mother   . Alzheimer's disease Sister   . Breast cancer Maternal Grandmother     Social History Social History  Substance Use Topics  . Smoking status: Never Smoker  . Smokeless tobacco: Never Used  . Alcohol use 0.6 oz/week    1 Shots of liquor per week     Comment: 2x/mo- social drinking only    Review of Systems  Constitutional: Negative for fever. + generalized weakness Eyes: Negative for visual changes. ENT: Negative for sore throat. Neck: No  neck pain  Cardiovascular: Negative for chest pain. Respiratory: +shortness of breath and cough Gastrointestinal: Negative for abdominal pain, vomiting or diarrhea. Genitourinary: Negative for dysuria. Musculoskeletal: Negative for back pain. Skin: Negative for rash. Neurological: Negative for headaches, weakness or numbness. Psych: No SI or HI  ____________________________________________   PHYSICAL EXAM:  VITAL SIGNS: ED Triage Vitals  Enc Vitals Group      BP --      Pulse Rate 07/09/16 1301 91     Resp 07/09/16 1301 20     Temp --      Temp Source 07/09/16 1301 Oral     SpO2 07/09/16 1301 91 %     Weight 07/09/16 1302 145 lb (65.8 kg)     Height 07/09/16 1302 5' (1.524 m)     Head Circumference --      Peak Flow --      Pain Score 07/09/16 1302 7     Pain Loc --      Pain Edu? --      Excl. in [place]? --     Constitutional: Alert and oriented. Well appearing and in no apparent distress. HEENT:      Head: Normocephalic and atraumatic.         Eyes: Conjunctivae are normal. Sclera is non-icteric. EOMI. PERRL      Mouth/Throat: Mucous membranes are dry.       Neck: Supple with no signs of meningismus. Cardiovascular: Regular rate and rhythm. No murmurs, gallops, or rubs. 2+ symmetrical distal pulses are present in all extremities. No JVD. Respiratory: Normal respiratory effort. Lungs are clear to auscultation bilaterally. No wheezes, crackles, or rhonchi.  Gastrointestinal: Soft, non tender, and non distended with positive bowel sounds. No rebound or guarding. Musculoskeletal: Nontender with normal range of motion in all extremities. No edema, cyanosis, or erythema of extremities. No c/t/l spine ttp Neurologic: Normal speech and language. A & O x3, PERRL, no nystagmus, CN II-XII intact, motor testing reveals good tone and bulk throughout. There is no evidence of pronator drift or dysmetria. Muscle strength is 5/5 throughout. Deep tendon reflexes are 2+ throughout with downgoing toes. Sensory examination is intact. Gait deferred Skin: Skin is warm, dry and intact. No rash noted. Psychiatric: Mood and affect are normal. Speech and behavior are normal.  ____________________________________________   LABS (all labs ordered are listed, but only abnormal results are displayed)  Labs Reviewed  CBC WITH DIFFERENTIAL/PLATELET - Abnormal; Notable for the following:       Result Value   Platelets 145 (*)    Neutro Abs 9.0 (*)    Lymphs Abs 0.2  (*)    All other components within normal limits  BASIC METABOLIC PANEL - Abnormal; Notable for the following:    Sodium 132 (*)    Potassium 2.1 (*)    Chloride 94 (*)    Glucose, Bld 161 (*)    BUN 30 (*)    Creatinine, Ser 1.67 (*)    Calcium 8.5 (*)    GFR calc non Af Amer 29 (*)    GFR calc Af Amer 33 (*)    All other components within normal limits  LACTIC ACID, PLASMA - Abnormal; Notable for the following:    Lactic Acid, Venous 2.7 (*)    All other components within normal limits  MAGNESIUM - Abnormal; Notable for the following:    Magnesium 1.6 (*)    All other components within normal limits  CULTURE, BLOOD (ROUTINE X 2)  CULTURE,  BLOOD (ROUTINE X 2)  INFLUENZA PANEL BY PCR (TYPE A & B)  URINALYSIS, COMPLETE (UACMP) WITH MICROSCOPIC  LACTIC ACID, PLASMA  LACTIC ACID, PLASMA   ____________________________________________  EKG  ED ECG REPORT I, Rudene Re, the attending physician, personally viewed and interpreted this ECG.  Normal sinus rhythm, rate of 87, normal PR and QRS intervals, prolonged QTC of 527, normal axis, no ST elevations or depressions, diffuse T-wave flattening. New for prior  ____________________________________________  RADIOLOGY  CXR: Infiltrate/pneumonia in right lower lobe anterolateral. Followup PA and lateral chest X-ray is recommended in 3-4 weeks following trial of antibiotic therapy to ensure resolution and exclude underlying malignancy. ____________________________________________   PROCEDURES  Procedure(s) performed: None Procedures Critical Care performed: yes  CRITICAL CARE Performed by: Rudene Re  ?  Total critical care time: 35 min  Critical care time was exclusive of separately billable procedures and treating other patients.  Critical care was necessary to treat or prevent imminent or life-threatening deterioration.  Critical care was time spent personally by me on the following activities:  development of treatment plan with patient and/or surrogate as well as nursing, discussions with consultants, evaluation of patient's response to treatment, examination of patient, obtaining history from patient or surrogate, ordering and performing treatments and interventions, ordering and review of laboratory studies, ordering and review of radiographic studies, pulse oximetry and re-evaluation of patient's condition.  ____________________________________________   INITIAL IMPRESSION / ASSESSMENT AND PLAN / ED COURSE  76 y.o. female the history of osteopenia, hypertension hyperlipidemia who presents for evaluation of productive cough, generalized weakness, N/V/D. VS WNL. Patient looks dry on exam. PE with no acute findings. Will check for Flu, CXR to eval for PNA, CBC, BMP, UA. Will give IVF and IV zofran.  Clinical Course as of Jul 09 1438  Thu Jul 09, 2016  1406 Chest x-ray concerning for right-sided pneumonia. Lactic acid is 2.7, normal WBCs, normal pulse and temperature area and patient was given ceftriaxone and azithromycin for community-acquired pneumonia. Also found to have hypokalemia with potassium of 2.1. That was supplemented by mouth and IV. Patient has acute kidney injury with creatinine of 1.67 (baseline is one). She was given 2 L of fluid.  [CV]    Clinical Course User Index [CV] Rudene Re, MD    Pertinent labs & imaging results that were available during my care of the patient were reviewed by me and considered in my medical decision making (see chart for details).    ____________________________________________   FINAL CLINICAL IMPRESSION(S) / ED DIAGNOSES  Final diagnoses:  Community acquired pneumonia of right lung, unspecified part of lung  Hypokalemia  AKI (acute kidney injury) ([place])  Hypomagnesemia      NEW MEDICATIONS STARTED DURING THIS VISIT:  New Prescriptions   No medications on file     Note:  This document was prepared using Dragon  voice recognition software and may include unintentional dictation errors.    Rudene Re, MD 07/09/16 [id number]

## 2016-07-09 NOTE — H&P (Addendum)
[place] [facility] [place] NAME: Sabrina Schmidt    MR#:  [id number]  DATE OF BIRTH:  Aug 30, 1940  DATE OF ADMISSION:  07/09/2016  PRIMARY CARE PHYSICIAN: WHITE, Orlene Och, NP   REQUESTING/REFERRING PHYSICIAN: Dr. Gonzella Lex  CHIEF COMPLAINT:   Chief Complaint  Patient presents with  . Weakness  . Cough    HISTORY OF PRESENT ILLNESS:  Sabrina Schmidt  is a 76 y.o. female with a known history of  Arthritis, hypertension, hyperlipidemia and osteoporosis presents to hospital secondary to worsening cough, congestion and weakness associated with fall yesterday. Patient is very independent and active at baseline. For last 3 weeks she's been having cold and congestion. Denies any fevers but has been having chills for the last couple of days. Yesterday she felt extremely weak, nauseated, has couple of episodes of vomiting as well. She started experiencing right-sided chest pain and her cough has become more hoarse. She was very weak and had a fall last night and was on the floor for a few hours before she was able to get up. She presented to the emergency room today. Chest x-ray showing pneumonia and has multiple lab abnormalities. She is hypoxic and is placed on 2 L oxygen at this time.  PAST MEDICAL HISTORY:   Past Medical History:  Diagnosis Date  . Arthritis    knees, back  . Epitrochlear bursitis   . Hypercholesterolemia   . Hypertension   . 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 Osteopenia   . Vitamin D deficiency     PAST SURGICAL HISTORY:   Past Surgical History:  Procedure Laterality Date  . ABDOMINAL HYSTERECTOMY     age 46  . CATARACT EXTRACTION W/PHACO Left 07/17/2015   Procedure: CATARACT EXTRACTION PHACO AND INTRAOCULAR LENS PLACEMENT (IOC);  Surgeon: Leandrew Koyanagi, MD;  Location: [place];  Service: Ophthalmology;  Laterality: Left;  . CATARACT EXTRACTION W/PHACO Right 08/28/2015   Procedure: CATARACT  EXTRACTION PHACO AND INTRAOCULAR LENS PLACEMENT (IOC);  Surgeon: Leandrew Koyanagi, MD;  Location: [place];  Service: Ophthalmology;  Laterality: Right;  . COLONOSCOPY WITH PROPOFOL N/A 02/18/2015   Procedure: COLONOSCOPY WITH PROPOFOL;  Surgeon: Josefine Class, MD;  Location: [facility] ENDOSCOPY;  Service: Endoscopy;  Laterality: N/A;  . FOOT SURGERY     Dr Elvina Mattes, [facility]  . INCISE AND DRAIN ABCESS Left    hand  . JOINT REPLACEMENT    . Rt. hip replacement      SOCIAL HISTORY:   Social History  Substance Use Topics  . Smoking status: Never Smoker  . Smokeless tobacco: Never Used  . Alcohol use 0.6 oz/week    1 Shots of liquor per week     Comment: 2x/mo- social drinking only    FAMILY HISTORY:   Family History  Problem Relation Age of Onset  . Alzheimer's disease Mother   . Breast cancer Mother   . Alzheimer's disease Sister   . Breast cancer Maternal Grandmother     DRUG ALLERGIES:   Allergies  Allergen Reactions  . Ivp Dye [Iodinated Diagnostic Agents] Rash    REVIEW OF SYSTEMS:   Review of Systems  Constitutional: Positive for chills and malaise/fatigue. Negative for fever and weight loss.  HENT: Negative for ear discharge, ear pain, hearing loss and nosebleeds.   Eyes: Negative for blurred vision, double vision and photophobia.  Respiratory: Positive for cough and shortness of breath. Negative for hemoptysis and wheezing.  Cardiovascular: Negative for chest pain, palpitations, orthopnea and leg swelling.  Gastrointestinal: Positive for nausea and vomiting. Negative for abdominal pain, constipation, diarrhea, heartburn and melena.  Genitourinary: Negative for dysuria and urgency.  Musculoskeletal: Positive for back pain, falls and myalgias. Negative for neck pain.  Skin: Negative for rash.  Neurological: Positive for weakness. Negative for dizziness, sensory change, speech change, focal weakness and headaches.  Endo/Heme/Allergies: Does not  bruise/bleed easily.  Psychiatric/Behavioral: Negative for depression.    MEDICATIONS AT HOME:   Prior to Admission medications   Medication Sig Start Date End Date Taking? Authorizing Provider  atorvastatin (LIPITOR) 40 MG tablet Take 40 mg by mouth daily.   Yes Historical Provider, MD  cetirizine (ZYRTEC) 10 MG tablet Take 10 mg by mouth daily as needed for allergies.   Yes Historical Provider, MD  hydrochlorothiazide (HYDRODIURIL) 25 MG tablet Take 25 mg by mouth daily as needed.    Yes Historical Provider, MD      VITAL SIGNS:  Blood pressure (!) 99/54, pulse 91, temperature 98.5 F (36.9 C), temperature source Oral, resp. rate 20, height 5' (1.524 m), weight 65.8 kg (145 lb), SpO2 91 %.  PHYSICAL EXAMINATION:   Physical Exam  GENERAL:  76 y.o.-year-old patient lying in the bed with no acute distress.  EYES: Pupils equal, round, reactive to light and accommodation. No scleral icterus. Extraocular muscles intact.  HEENT: Head atraumatic, normocephalic. Oropharynx and nasopharynx clear.  NECK:  Supple, no jugular venous distention. No thyroid enlargement, no tenderness.  LUNGS: Normal breath sounds bilaterally, no wheezing, rales or crepitation. Right basilar rhonchi heard. No use of accessory muscles of respiration.  CARDIOVASCULAR: S1, S2 normal. No rubs, or gallops. 3/6 systolic murmur present.  ABDOMEN: Soft, nontender, nondistended. Bowel sounds present. No organomegaly or mass.  EXTREMITIES: No pedal edema, cyanosis, or clubbing. Right sided pain- no bruising noted. NEUROLOGIC: Cranial nerves II through XII are intact. Muscle strength 5/5 in all extremities. Sensation intact. Gait not checked.  PSYCHIATRIC: The patient is alert and oriented x 3.  SKIN: No obvious rash, lesion, or ulcer.   LABORATORY PANEL:   CBC  Recent Labs Lab 07/09/16 1323  WBC 9.7  HGB 14.1  HCT 39.7  PLT 145*    ------------------------------------------------------------------------------------------------------------------  Chemistries   Recent Labs Lab 07/09/16 1323  NA 132*  K 2.1*  CL 94*  CO2 25  GLUCOSE 161*  BUN 30*  CREATININE 1.67*  CALCIUM 8.5*  MG 1.6*   ------------------------------------------------------------------------------------------------------------------  Cardiac Enzymes No results for input(s): TROPONINI in the last 168 hours. ------------------------------------------------------------------------------------------------------------------  RADIOLOGY:  Dg Chest 2 View  Result Date: 07/09/2016 CLINICAL DATA:  Productive cough, congestion for 3 weeks EXAM: CHEST  2 VIEW COMPARISON:  05/20/2010 FINDINGS: Cardiomediastinal silhouette is stable. There is infiltrate/pneumonia in right lower lobe anterolateral segment. Follow-up to resolution after appropriate treatment is recommended. Left lung is clear. No pulmonary edema. IMPRESSION: Infiltrate/pneumonia in right lower lobe anterolateral. Followup PA and lateral chest X-ray is recommended in 3-4 weeks following trial of antibiotic therapy to ensure resolution and exclude underlying malignancy. Electronically Signed   By: Lahoma Crocker M.D.   On: 07/09/2016 13:58    EKG:   Orders placed or performed in visit on 05/20/10  . EKG 12-Lead    IMPRESSION AND PLAN:   Sabrina Schmidt  is a 76 y.o. female with a known history of  Arthritis, hypertension, hyperlipidemia and osteoporosis presents to hospital secondary to worsening cough, congestion and weakness associated with fall yesterday.   #  1 sepsis-secondary to right lower lobe community-acquired pneumonia. -Follow blood cultures. Added on Rocephin and azithromycin. -Added cough medications. Continue oxygen support as needed. -Follow up with physical therapy. - Influenza test is pending  #2 fall-secondary to weakness. Complains of right-sided pain. No bruising. No  limitation to range of motion of any joints. -Added muscle relaxants. Physical therapy consult  #3 acute renal failure- likely ATN from sepsis. IV hydration and follow-up  #4 hypokalemia-being replaced aggressively and recheck later today Hold hydrochlorothiazide  #5. Hyperlipidemia-continue statin  #6 DVT prophylaxis-on Lovenox  Physical therapy consulted   All the records are reviewed and case discussed with ED provider. Management plans discussed with the patient, family and they are in agreement.  CODE STATUS: Full code  TOTAL TIME TAKING CARE OF THIS PATIENT: 50 minutes.    Sabrina Schmidt M.D on 07/09/2016 at 2:29 PM  Between 7am to 6pm - Pager - [phone]  After 6pm go to [email or web address] - Proofreader  [facility]  Office  [phone]  CC: Primary care physician; WHITE, Orlene Och, NP

## 2016-07-09 NOTE — Progress Notes (Signed)
Pharmacy Antibiotic Note  Sabrina Schmidt is a 76 y.o. female admitted on 07/09/2016 with pneumonia.  Pharmacy has been consulted for ceftriaxone dosing. Patient is also prescribed azithromycin.  Plan: Ceftriaxone 1 g IV daily  Height: 5' (152.4 cm) Weight: 145 lb (65.8 kg) IBW/kg (Calculated) : 45.5  Temp (24hrs), Avg:98.5 F (36.9 C), Min:98.5 F (36.9 C), Max:98.5 F (36.9 C)   Recent Labs Lab 07/09/16 1323  WBC 9.7  CREATININE 1.67*  LATICACIDVEN 2.7*    Estimated Creatinine Clearance: 24.6 mL/min (by C-G formula based on SCr of 1.67 mg/dL (H)).    Allergies  Allergen Reactions  . Ivp Dye [Iodinated Diagnostic Agents] Rash   Antimicrobials this admission: azithromycin 2/1 >>  CTX 2/1 >>   Dose adjustments this admission:  Microbiology results: 2/1 BCx: Sent  Thank you for allowing pharmacy to be a part of this patient's care.  Lenis Noon, PharmD Clinical Pharmacist 07/09/2016 2:57 PM

## 2016-07-09 NOTE — Plan of Care (Signed)
Problem: Nutrition: Goal: Adequate nutrition will be maintained Outcome: Progressing Educated patient on the importance of good nutrition

## 2016-07-10 DIAGNOSIS — I959 Hypotension, unspecified: Secondary | ICD-10-CM

## 2016-07-10 DIAGNOSIS — J181 Lobar pneumonia, unspecified organism: Secondary | ICD-10-CM

## 2016-07-10 DIAGNOSIS — A419 Sepsis, unspecified organism: Principal | ICD-10-CM

## 2016-07-10 LAB — GLUCOSE, CAPILLARY: Glucose-Capillary: 120 mg/dL — ABNORMAL HIGH (ref 65–99)

## 2016-07-10 LAB — BASIC METABOLIC PANEL
Anion gap: 4 — ABNORMAL LOW (ref 5–15)
BUN: 31 mg/dL — AB (ref 6–20)
CHLORIDE: 112 mmol/L — AB (ref 101–111)
CO2: 21 mmol/L — AB (ref 22–32)
Calcium: 6.8 mg/dL — ABNORMAL LOW (ref 8.9–10.3)
Creatinine, Ser: 1.28 mg/dL — ABNORMAL HIGH (ref 0.44–1.00)
GFR calc Af Amer: 46 mL/min — ABNORMAL LOW (ref >60)
GFR calc non Af Amer: 40 mL/min — ABNORMAL LOW (ref >60)
Glucose, Bld: 125 mg/dL — ABNORMAL HIGH (ref 65–99)
POTASSIUM: 4.4 mmol/L (ref 3.5–5.1)
Sodium: 137 mmol/L (ref 135–145)

## 2016-07-10 LAB — MRSA PCR SCREENING: MRSA by PCR: NEGATIVE

## 2016-07-10 LAB — CBC
HEMATOCRIT: 35.4 % (ref 35.0–47.0)
Hemoglobin: 12.3 g/dL (ref 12.0–16.0)
MCH: 30.4 pg (ref 26.0–34.0)
MCHC: 34.7 g/dL (ref 32.0–36.0)
MCV: 87.5 fL (ref 80.0–100.0)
Platelets: 152 10*3/uL (ref 150–440)
RBC: 4.05 MIL/uL (ref 3.80–5.20)
RDW: 13 % (ref 11.5–14.5)
WBC: 9.9 10*3/uL (ref 3.6–11.0)

## 2016-07-10 MED ORDER — PHENYLEPHRINE HCL 10 MG/ML IJ SOLN
0.0000 ug/min | INTRAMUSCULAR | Status: DC
  Administered 2016-07-10: 50 ug/min via INTRAVENOUS
  Administered 2016-07-10: 20 ug/min via INTRAVENOUS
  Filled 2016-07-10 (×3): qty 1

## 2016-07-10 MED ORDER — INFLUENZA VAC SPLIT QUAD 0.5 ML IM SUSY
0.5000 mL | PREFILLED_SYRINGE | INTRAMUSCULAR | Status: AC | PRN
  Administered 2016-07-12: 11:00:00 0.5 mL via INTRAMUSCULAR
  Filled 2016-07-10: qty 0.5

## 2016-07-10 MED ORDER — IPRATROPIUM-ALBUTEROL 0.5-2.5 (3) MG/3ML IN SOLN
3.0000 mL | RESPIRATORY_TRACT | Status: DC | PRN
  Administered 2016-07-11: 08:00:00 3 mL via RESPIRATORY_TRACT
  Filled 2016-07-10: qty 3

## 2016-07-10 MED ORDER — SODIUM CHLORIDE 0.9 % IV BOLUS (SEPSIS)
500.0000 mL | Freq: Once | INTRAVENOUS | Status: AC
  Administered 2016-07-10: 500 mL via INTRAVENOUS

## 2016-07-10 MED ORDER — GUAIFENESIN-DM 100-10 MG/5ML PO SYRP: 5.0000 mL | ORAL_SOLUTION | ORAL | Status: DC | PRN

## 2016-07-10 MED ORDER — CALCIUM CARBONATE ANTACID 500 MG PO CHEW
1.0000 | CHEWABLE_TABLET | Freq: Three times a day (TID) | ORAL | Status: DC
  Administered 2016-07-10 – 2016-07-12 (×5): 200 mg via ORAL
  Filled 2016-07-10 (×5): qty 1

## 2016-07-10 MED ORDER — SODIUM CHLORIDE 0.9 % IV BOLUS (SEPSIS)
1000.0000 mL | Freq: Once | INTRAVENOUS | Status: AC
  Administered 2016-07-10: 1000 mL via INTRAVENOUS

## 2016-07-10 MED ORDER — FAMOTIDINE 20 MG PO TABS
20.0000 mg | ORAL_TABLET | Freq: Every day | ORAL | Status: DC
  Administered 2016-07-10 – 2016-07-11 (×2): 20 mg via ORAL
  Filled 2016-07-10 (×2): qty 1

## 2016-07-10 MED ORDER — ENOXAPARIN SODIUM 40 MG/0.4ML ~~LOC~~ SOLN
40.0000 mg | SUBCUTANEOUS | Status: DC
  Administered 2016-07-10 – 2016-07-11 (×2): 40 mg via SUBCUTANEOUS
  Filled 2016-07-10 (×2): qty 0.4

## 2016-07-10 MED ORDER — MENTHOL 3 MG MT LOZG
1.0000 | LOZENGE | OROMUCOSAL | Status: DC | PRN
  Filled 2016-07-10 (×2): qty 9

## 2016-07-10 NOTE — Progress Notes (Signed)
Patient ID: Sabrina Schmidt, female   DOB: 04/05/1941, 76 y.o.   MRN: [id number]  Called by nursing regarding persistent hypotension despite 2 500 cc boluses of normal saline. Patient is feeling weak, diaphoretic and nauseous. Will transfer to ICU for closer monitoring and consideration of pressor support. Discussed with e-link Dr. Detterding and NP [place].

## 2016-07-10 NOTE — Progress Notes (Signed)
[place] at [place] NAME: Sabrina Schmidt    MR#:  [id number]  DATE OF BIRTH:  11-17-1940  SUBJECTIVE:   Patient doing well. Still endorses a productive cough, SOB and weakness. Denies nausea, vomiting and fever. Also says she is experiencing some acid reflux, for which she usually takes Rolaids at home. VSS and no acute events to report.   REVIEW OF SYSTEMS:   Review of Systems  Constitutional: Negative for chills, fever and weight loss.  HENT: Negative for ear pain, hearing loss and tinnitus.   Eyes: Negative for blurred vision, double vision and photophobia.  Respiratory: Positive for cough, sputum production and shortness of breath. Negative for hemoptysis.   Gastrointestinal: Positive for heartburn. Negative for abdominal pain, nausea and vomiting.  Genitourinary: Negative for dysuria, frequency and urgency.  Musculoskeletal: Negative for back pain, myalgias and neck pain.  Skin: Negative for itching and rash.  Neurological: Positive for weakness. Negative for dizziness, tingling, tremors and headaches.  Endo/Heme/Allergies: Negative for environmental allergies and polydipsia. Does not bruise/bleed easily.  Psychiatric/Behavioral: Negative for depression, substance abuse and suicidal ideas.    DRUG ALLERGIES:   Allergies  Allergen Reactions  . Ivp Dye [Iodinated Diagnostic Agents] Rash    VITALS:  Blood pressure (!) 105/59, pulse 87, temperature 98.6 F (37 C), resp. rate 17, height 5' (1.524 m), weight 65.8 kg (145 lb), SpO2 94 %.  PHYSICAL EXAMINATION:   Physical Exam  GENERAL:  76 y.o.-year-old patient lying in the bed with no acute distress.  EYES: Pupils equal, round, reactive to light and accommodation. No scleral icterus. Extraocular muscles intact.  HEENT: Head atraumatic, normocephalic. Oropharynx and nasopharynx clear.  NECK:  Supple, no jugular venous distention. No thyroid enlargement, no tenderness.  LUNGS:  Normal breath sounds bilaterally, no wheezing, rales, rhonchi. No use of accessory muscles of respiration.  CARDIOVASCULAR: S1, S2 normal. No murmurs, rubs, or gallops.  ABDOMEN: Soft, nontender, nondistended. Bowel sounds present. No organomegaly or mass.  EXTREMITIES: No cyanosis, clubbing or edema b/l.    NEUROLOGIC: Cranial nerves II through XII are intact. No focal Motor or sensory deficits b/l.   PSYCHIATRIC:  patient is alert and oriented x 3.  SKIN: No obvious rash, lesion, or ulcer.   LABORATORY PANEL:  CBC  Recent Labs Lab 07/10/16 0629  WBC 9.9  HGB 12.3  HCT 35.4  PLT 152    Chemistries   Recent Labs Lab 07/09/16 1323  07/10/16 0629  NA 132*  < > 137  K 2.1*  < > 4.4  CL 94*  < > 112*  CO2 25  < > 21*  GLUCOSE 161*  < > 125*  BUN 30*  < > 31*  CREATININE 1.67*  < > 1.28*  CALCIUM 8.5*  < > 6.8*  MG 1.6*  --   --   < > = values in this interval not displayed. Cardiac Enzymes No results for input(s): TROPONINI in the last 168 hours. RADIOLOGY:  Dg Chest 2 View  Result Date: 07/09/2016 CLINICAL DATA:  Productive cough, congestion for 3 weeks EXAM: CHEST  2 VIEW COMPARISON:  05/20/2010 FINDINGS: Cardiomediastinal silhouette is stable. There is infiltrate/pneumonia in right lower lobe anterolateral segment. Follow-up to resolution after appropriate treatment is recommended. Left lung is clear. No pulmonary edema. IMPRESSION: Infiltrate/pneumonia in right lower lobe anterolateral. Followup PA and lateral chest X-ray is recommended in 3-4 weeks following trial of antibiotic therapy to ensure resolution and exclude underlying  malignancy. Electronically Signed   By: Lahoma Crocker M.D.   On: 07/09/2016 13:58   Ct Head Wo Contrast  Result Date: 07/09/2016 CLINICAL DATA:  Slurred speech. Generalized weakness with fall yesterday. EXAM: CT HEAD WITHOUT CONTRAST TECHNIQUE: Contiguous axial images were obtained from the base of the skull through the vertex without intravenous  contrast. COMPARISON:  10/11/2013 brain MRI. FINDINGS: Brain: No evidence of parenchymal hemorrhage or extra-axial fluid collection. No mass lesion, mass effect, or midline shift. No CT evidence of acute infarction. Slight symmetric prominence of the CSF spaces over the bifrontal convexities appears unchanged since 10/11/2013 brain MRI and is compatible with mild generalized cerebral volume loss. No ventriculomegaly. Vascular: No hyperdense vessel or unexpected calcification. Skull: No evidence of calvarial fracture. Sinuses/Orbits: The visualized paranasal sinuses are essentially clear. Other:  The mastoid air cells are unopacified. IMPRESSION: 1. No evidence of acute intracranial abnormality. No evidence of calvarial fracture. 2. Mild generalized cerebral volume loss. Electronically Signed   By: Ilona Sorrel M.D.   On: 07/09/2016 19:16   ASSESSMENT AND PLAN:   76 y.o female with past medical history of hypertension, arthritis, hyperlipidemia and osteoporosis presented to the hospital with cough, congestion and weakness associated with a fall 2 days ago. Found to have right lower lobe community acquired pneumonia.   1) Sepsis - secondary to right lower lobe community acquired pneumonia - Continue IV Rocephin and azithromycin - Follow blood cultures  - Cough: continue tessalon and Tussionex. added lozenges for supportive measures - Begin incentive spirometry  2) Fall - with right sided pain - secondary to weakness.  - Head CT negative - Follow up with physical therapy  3) Acute renal failure - likely ATN from sepsis -  BUN 31 from 30; Creatinine 1.28 from 1.70 - Continue IVF hydration   4) Hypokalemia - resolved. - Potassium levels 4.4 today from 2.1 on admission   5) GERD - per patient request, added Pepcid 20mg  daily by mouth at bedtime   6) Hyperlipidemia - Continue statin   Case discussed with Care Management/Social Worker. Management plans discussed with the patient, family and  they are in agreement.  CODE STATUS: full  DVT Prophylaxis: Lovenox  TOTAL TIME TAKING CARE OF THIS PATIENT: 30 minutes.  >50% time spent on counselling and coordination of care   Eloise Levels,  Physician Assistant Student    This is a Ship broker note, solely for education purposes. This note was reviewed with and approved by attending preceptor.  CC: Primary care physician; WHITE, Orlene Och, NP

## 2016-07-10 NOTE — Progress Notes (Signed)
Provider- Pharmacist Communication   Patient CrCl >75ml/min and BMI <40. Will change patient to enoxaparin 40mg  Q24h. Pharmacy will continue to monitor.   Loree Fee, PharmD 9:11 AM 07/10/2016

## 2016-07-10 NOTE — Care Management Note (Addendum)
Case Management Note  Patient Details  Name: Sabrina Schmidt MRN: [id number] Date of Birth: 1940-08-23  Additional Comments:  Patient admitted from home with sepsis due to pneumonia.  She was transferred to ICU due to hypotension and need for pressors.  She lives alone but will have someone with her at discharge round the clock for at least 2 weeks.  She has walker, bsc, bathroom dme available to her.  02 is acute. Agreeable to home health and no agency preference.  heads up referral to [facility]    Expected Discharge Date:  07/12/16               Expected Discharge Plan:   (Too early in process to identify a expected discahrge plan)  In-House Referral:     Discharge [facility] Acute Care Choice:   No preference Choice offered to:   Patient  DME Arranged:    DME Agency:     [facility] [facility] Arranged:   yes- heads up to [place]:   [facility]   Status of Service:     If discussed at [place] of Stay Meetings, dates discussed:      Katrina Stack, RN 07/10/2016, 8:36 AM

## 2016-07-10 NOTE — Progress Notes (Signed)
Patient is going to be transferred. Patient asked for nurse to contact her friend Jenny Reichmann at [phone] to inform her of the transfer and to give her the password because her cell phone is out of battery.  I tried several times and got a busy signal each time.

## 2016-07-10 NOTE — Progress Notes (Addendum)
[place] at [place] NAME: Sabrina Schmidt    MR#:  [id number]  DATE OF BIRTH:  June 26, 1940  SUBJECTIVE:  Came in with cold and congestion and found to have sepsis with hypotesnion and pneumonia -family in the room. Feels a lot bette today BP improving -weaned off oxygen  REVIEW OF SYSTEMS:   Review of Systems  Constitutional: Negative for chills, fever and weight loss.  HENT: Negative for ear discharge, ear pain and nosebleeds.   Eyes: Negative for blurred vision, pain and discharge.  Respiratory: Positive for shortness of breath. Negative for sputum production, wheezing and stridor.   Cardiovascular: Negative for chest pain, palpitations, orthopnea and PND.  Gastrointestinal: Negative for abdominal pain, diarrhea, nausea and vomiting.  Genitourinary: Negative for frequency and urgency.  Musculoskeletal: Negative for back pain and joint pain.  Neurological: Positive for weakness. Negative for sensory change, speech change and focal weakness.  Psychiatric/Behavioral: Negative for depression and hallucinations. The patient is not nervous/anxious.    Tolerating Diet:yes Tolerating PT: pending  DRUG ALLERGIES:   Allergies  Allergen Reactions  . Ivp Dye [Iodinated Diagnostic Agents] Rash    VITALS:  Blood pressure (!) 105/59, pulse 87, temperature 98.6 F (37 C), resp. rate 17, height 5' (1.524 m), weight 65.8 kg (145 lb), SpO2 94 %.  PHYSICAL EXAMINATION:   Physical Exam  GENERAL:  76 y.o.-year-old patient lying in the bed with no acute distress.  EYES: Pupils equal, round, reactive to light and accommodation. No scleral icterus. Extraocular muscles intact.  HEENT: Head atraumatic, normocephalic. Oropharynx and nasopharynx clear.  NECK:  Supple, no jugular venous distention. No thyroid enlargement, no tenderness.  LUNGS: Normal breath sounds bilaterally, no wheezing, rales, rhonchi. No use of accessory muscles of respiration.   CARDIOVASCULAR: S1, S2 normal. No murmurs, rubs, or gallops.  ABDOMEN: Soft, nontender, nondistended. Bowel sounds present. No organomegaly or mass.  EXTREMITIES: No cyanosis, clubbing or edema b/l.    NEUROLOGIC: Cranial nerves II through XII are intact. No focal Motor or sensory deficits b/l.   PSYCHIATRIC:  patient is alert and oriented x 3.  SKIN: No obvious rash, lesion, or ulcer.   LABORATORY PANEL:  CBC  Recent Labs Lab 07/10/16 0629  WBC 9.9  HGB 12.3  HCT 35.4  PLT 152    Chemistries   Recent Labs Lab 07/09/16 1323  07/10/16 0629  NA 132*  < > 137  K 2.1*  < > 4.4  CL 94*  < > 112*  CO2 25  < > 21*  GLUCOSE 161*  < > 125*  BUN 30*  < > 31*  CREATININE 1.67*  < > 1.28*  CALCIUM 8.5*  < > 6.8*  MG 1.6*  --   --   < > = values in this interval not displayed. Cardiac Enzymes No results for input(s): TROPONINI in the last 168 hours. RADIOLOGY:  Dg Chest 2 View  Result Date: 07/09/2016 CLINICAL DATA:  Productive cough, congestion for 3 weeks EXAM: CHEST  2 VIEW COMPARISON:  05/20/2010 FINDINGS: Cardiomediastinal silhouette is stable. There is infiltrate/pneumonia in right lower lobe anterolateral segment. Follow-up to resolution after appropriate treatment is recommended. Left lung is clear. No pulmonary edema. IMPRESSION: Infiltrate/pneumonia in right lower lobe anterolateral. Followup PA and lateral chest X-ray is recommended in 3-4 weeks following trial of antibiotic therapy to ensure resolution and exclude underlying malignancy. Electronically Signed   By: Lahoma Crocker M.D.   On: 07/09/2016 13:58  Ct Head Wo Contrast  Result Date: 07/09/2016 CLINICAL DATA:  Slurred speech. Generalized weakness with fall yesterday. EXAM: CT HEAD WITHOUT CONTRAST TECHNIQUE: Contiguous axial images were obtained from the base of the skull through the vertex without intravenous contrast. COMPARISON:  10/11/2013 brain MRI. FINDINGS: Brain: No evidence of parenchymal hemorrhage or  extra-axial fluid collection. No mass lesion, mass effect, or midline shift. No CT evidence of acute infarction. Slight symmetric prominence of the CSF spaces over the bifrontal convexities appears unchanged since 10/11/2013 brain MRI and is compatible with mild generalized cerebral volume loss. No ventriculomegaly. Vascular: No hyperdense vessel or unexpected calcification. Skull: No evidence of calvarial fracture. Sinuses/Orbits: The visualized paranasal sinuses are essentially clear. Other:  The mastoid air cells are unopacified. IMPRESSION: 1. No evidence of acute intracranial abnormality. No evidence of calvarial fracture. 2. Mild generalized cerebral volume loss. Electronically Signed   By: Ilona Sorrel M.D.   On: 07/09/2016 19:16   ASSESSMENT AND PLAN:  76 y.o female with past medical history of hypertension, arthritis, hyperlipidemia and osteoporosis presented to the hospital with cough, congestion and weakness associated with a fall 2 days ago. Found to have right lower lobe community acquired pneumonia.   1) Sepsis - secondary to right lower lobe community acquired pneumonia - Continue IV Rocephin and azithromycin - Follow blood cultures  - Cough: continue tessalon and Tussionex. added lozenges for supportive measures - Begin incentive spirometry -pt on IV phenyephrine for hypotention---wean as bp improves  2) Fall - with right sided pain - secondary to weakness.  - Head CT negative - Follow up with physical therapy  3) Acute renal failure - likely ATN from sepsis -  BUN 31 from 30; Creatinine 1.28 from 1.70 - Continue IVF hydration   4) Hypokalemia - resolved. - Potassium levels 4.4 today from 2.1 on admission   5) GERD - per patient request, added Pepcid 20mg  daily by mouth at bedtime   6) Hyperlipidemia - Continue statin  Transfer to medical floor once off IV pressors. NO tele needed.  D/w pt and dter  Case discussed with Care Management/Social Worker. Management  plans discussed with the patient, family and they are in agreement.  CODE STATUS:FULL  DVT Prophylaxis: *lovenox  TOTAL criticalTIME TAKING CARE OF THIS PATIENT: 80minutes.  >50% time spent on counselling and coordination of care  POSSIBLE D/C IN 1-2 DAYS, DEPENDING ON CLINICAL CONDITION.  Note: This dictation was prepared with Dragon dictation along with smaller phrase technology. Any transcriptional errors that result from this process are unintentional.  Ceci Taliaferro M.D on 07/10/2016 at 2:59 PM  Between 7am to 6pm - Pager - [phone]  After 6pm go to [email or web address] - Proofreader  [facility]  Office  [phone]  CC: Primary care physician; WHITE, Orlene Och, NP

## 2016-07-10 NOTE — Progress Notes (Signed)
Weaned off Neosynephrine drip this pm. Coughing . Sputum thick and tan. Up and down to bedside commode. Drinking lots of fluids but appetite is poor.

## 2016-07-10 NOTE — Progress Notes (Signed)
Report given and patient transferred to ICU-11 with all of patient belongings. Patient's phone charged and she notified her friend of the transfer. Noted that the phone # [phone]- changed it in computer.

## 2016-07-10 NOTE — Progress Notes (Signed)
Pharmacy Antibiotic Note  Sabrina Schmidt is a 76 y.o. female admitted on 07/09/2016 with pneumonia.  Pharmacy has been consulted for ceftriaxone dosing.  Plan: Ceftriaxone 1g IV Q24h.   Height: 5' (152.4 cm) Weight: 145 lb (65.8 kg) IBW/kg (Calculated) : 45.5  Temp (24hrs), Avg:98.4 F (36.9 C), Min:97.4 F (36.3 C), Max:100.1 F (37.8 C)   Recent Labs Lab 07/09/16 1323 07/09/16 1446 07/09/16 1758 07/09/16 2101 07/10/16 0629  WBC 9.7  --   --   --  9.9  CREATININE 1.67*  --   --  1.70* 1.28*  LATICACIDVEN 2.7* 2.4* 1.8  --   --     Estimated Creatinine Clearance: 32.1 mL/min (by C-G formula based on SCr of 1.28 mg/dL (H)).    Allergies  Allergen Reactions  . Ivp Dye [Iodinated Diagnostic Agents] Rash    Antimicrobials this admission: CTX 2/1 >> Azithromycin 2/1 >>  Microbiology results: 2/1 BCx: NGTD 2/2 MRSA PCR: neg  Thank you for allowing pharmacy to be a part of this patient's care.  Loree Fee, PharmD 07/10/2016 1:48 PM

## 2016-07-10 NOTE — Consult Note (Signed)
PULMONARY / CRITICAL CARE MEDICINE   Name: Sabrina Schmidt MRN: [id number] DOB: 06/09/1940    ADMISSION DATE:  07/09/2016 CONSULTATION DATE:  07/10/16  REFERRING MD:  Dr. Ara Kussmaul  CHIEF COMPLAINT:  Hypotension secondary to sepsis   HISTORY OF PRESENT ILLNESS:   Sabrina Schmidt is a 76 year old female with past medical history significant for Arthritis, Hypercholesterolemia, Hypertension and osteoporosis.  Patient presents to [facility] on 2/1 with worsening cough, congestion and weakness.  Patient was admitted with RLL PNA.Marland Kitchen  Patient was started on azithromycin and Rocephin. On 2/2 patient was noted to be hypotensive .  Patient received fluid bolus but her BP did not respond to fluids therefore Hospitalist team consulted PCCM team for further management.  PAST MEDICAL HISTORY :  She  has a past medical history of Arthritis; Epitrochlear bursitis; Hypercholesterolemia; Hypertension; Lumbar herniated disc; Osteopenia; and Vitamin D deficiency.  PAST SURGICAL HISTORY: She  has a past surgical history that includes Joint replacement; Rt. hip replacement; Colonoscopy with propofol (N/A, 02/18/2015); Foot surgery; Incise and drain abcess (Left); Abdominal hysterectomy; Cataract extraction w/PHACO (Left, 07/17/2015); and Cataract extraction w/PHACO (Right, 08/28/2015).  Allergies  Allergen Reactions  . Ivp Dye [Iodinated Diagnostic Agents] Rash    No current facility-administered medications on file prior to encounter.    Current Outpatient Prescriptions on File Prior to Encounter  Medication Sig  . atorvastatin (LIPITOR) 40 MG tablet Take 40 mg by mouth daily.  . cetirizine (ZYRTEC) 10 MG tablet Take 10 mg by mouth daily as needed for allergies.  . hydrochlorothiazide (HYDRODIURIL) 25 MG tablet Take 25 mg by mouth daily as needed.     FAMILY HISTORY:  Her indicated that the status of her mother is unknown. She indicated that the status of her sister is unknown. She indicated that the status of her  maternal grandmother is unknown.    SOCIAL HISTORY: She  reports that she has never smoked. She has never used smokeless tobacco. She reports that she drinks about 0.6 oz of alcohol per week . She reports that she does not use drugs.  REVIEW OF SYSTEMS:   Review of Systems  Constitutional: Positive for malaise/fatigue. Negative for chills, fever and weight loss.  HENT: Negative for nosebleeds and sinus pain.   Eyes: Negative for photophobia and pain.  Respiratory: Positive for cough. Negative for sputum production, shortness of breath and stridor.   Cardiovascular: Negative for claudication and leg swelling.  Gastrointestinal: Negative for abdominal pain, constipation and diarrhea.  Genitourinary: Negative for frequency and urgency.  Musculoskeletal: Negative for back pain and neck pain.  Neurological: Negative for tremors, sensory change, speech change, focal weakness and seizures.  Psychiatric/Behavioral: Negative for hallucinations and substance abuse. The patient is not nervous/anxious.      SUBJECTIVE:  Patient states that "she feels weak and nauseated"  VITAL SIGNS: BP (!) 78/35   Pulse 74   Temp 97.4 F (36.3 C) (Oral)   Resp (!) 23   Ht 5' (1.524 m)   Wt 65.8 kg (145 lb)   SpO2 92%   BMI 28.32 kg/m   HEMODYNAMICS:    VENTILATOR SETTINGS:    INTAKE / OUTPUT: I/O last 3 completed shifts: In: 1050 [IV Piggyback:1050] Out: 0   PHYSICAL EXAMINATION: General: Elderly female ,found on [place],, does not appear to be in major distress Neuro: Neuro Intact HEENT:  AT,[place],No JVD ,PERRLA Cardiovascular: S1S2, tachycardic, No mrg noted Lungs: diminished bilaterally, no wheezes, crackles and rhonchi noted Abdomen:soft, Non tender, Active BS  Musculoskeletal:  No inflammation/deformity noted Skin:  Warm, dry and intact  LABS:  BMET  Recent Labs Lab 07/09/16 1323 07/09/16 2101  NA 132* 137  K 2.1* 3.5  CL 94* 106  CO2 25 24  BUN 30* 30*  CREATININE 1.67* 1.70*   GLUCOSE 161* 139*    Electrolytes  Recent Labs Lab 07/09/16 1323 07/09/16 2101  CALCIUM 8.5* 7.4*  MG 1.6*  --     CBC  Recent Labs Lab 07/09/16 1323  WBC 9.7  HGB 14.1  HCT 39.7  PLT 145*    Coag's No results for input(s): APTT, INR in the last 168 hours.  Sepsis Markers  Recent Labs Lab 07/09/16 1323 07/09/16 1446 07/09/16 1758  LATICACIDVEN 2.7* 2.4* 1.8  PROCALCITON 17.75  --   --     ABG No results for input(s): PHART, PCO2ART, PO2ART in the last 168 hours.  Liver Enzymes No results for input(s): AST, ALT, ALKPHOS, BILITOT, ALBUMIN in the last 168 hours.  Cardiac Enzymes No results for input(s): TROPONINI, PROBNP in the last 168 hours.  Glucose No results for input(s): GLUCAP in the last 168 hours.  Imaging Dg Chest 2 View  Result Date: 07/09/2016 CLINICAL DATA:  Productive cough, congestion for 3 weeks EXAM: CHEST  2 VIEW COMPARISON:  05/20/2010 FINDINGS: Cardiomediastinal silhouette is stable. There is infiltrate/pneumonia in right lower lobe anterolateral segment. Follow-up to resolution after appropriate treatment is recommended. Left lung is clear. No pulmonary edema. IMPRESSION: Infiltrate/pneumonia in right lower lobe anterolateral. Followup PA and lateral chest X-ray is recommended in 3-4 weeks following trial of antibiotic therapy to ensure resolution and exclude underlying malignancy. Electronically Signed   By: Lahoma Crocker M.D.   On: 07/09/2016 13:58   Ct Head Wo Contrast  Result Date: 07/09/2016 CLINICAL DATA:  Slurred speech. Generalized weakness with fall yesterday. EXAM: CT HEAD WITHOUT CONTRAST TECHNIQUE: Contiguous axial images were obtained from the base of the skull through the vertex without intravenous contrast. COMPARISON:  10/11/2013 brain MRI. FINDINGS: Brain: No evidence of parenchymal hemorrhage or extra-axial fluid collection. No mass lesion, mass effect, or midline shift. No CT evidence of acute infarction. Slight symmetric  prominence of the CSF spaces over the bifrontal convexities appears unchanged since 10/11/2013 brain MRI and is compatible with mild generalized cerebral volume loss. No ventriculomegaly. Vascular: No hyperdense vessel or unexpected calcification. Skull: No evidence of calvarial fracture. Sinuses/Orbits: The visualized paranasal sinuses are essentially clear. Other:  The mastoid air cells are unopacified. IMPRESSION: 1. No evidence of acute intracranial abnormality. No evidence of calvarial fracture. 2. Mild generalized cerebral volume loss. Electronically Signed   By: Ilona Sorrel M.D.   On: 07/09/2016 19:16     STUDIES:  2/1 CT Head>>No evidence of acute intracranial abnormality. No evidence ofcalvarial fracture.  Mild generalized cerebral volume loss  CULTURES: 2/1 BC>>  ANTIBIOTICS: 2/1 Azithromycin>> 2/1 Ceftriaxone>>  SIGNIFICANT EVENTS: 2/1>> Patient admitted to [facility] with PNA 2/2>> Transferred to ICU with hypotension possibly related to sepsis requiring pressors  Lines and tube None   ASSESSMENT / PLAN:  PULMONARY A: RLL PNA P:   Support with O2 to keep sats>94% Continue Ceftriaxone and Azithromycin Bronchodilators PRN CARDIOVASCULAR A:  Septic shock secondary to PNA Hyperlipidemia P:  Continuous Telemetry Neosynephrine gtt Keep MAP goals>65 Continue Statin  RENAL A:   Acute Kidney Injury related to sepsis P:   Replace electrolytes per usual guidelines Follow chemistry  GASTROINTESTINAL A:   No active issues P:   Regular  diet  HEMATOLOGIC A:   No active issues P:  Transfuse per usual guidelines Lovenox for DVT prophylaxis  INFECTIOUS A:   Septic shock secondary to RLL PNA Elevated Procalcitonin P:   Continue Antibiotics as above Follow cultures Monitor Fever curve Follow CBC  ENDOCRINE A:   No active issues P:   BG checks intermittently with BMP  NEUROLOGIC A:   Fall secondary to weakness P:   Head CT 2/1 negative  Bincy  Varughese,AG-ACNP Pulmonary and [place]   07/10/2016, 2:10 AM  PCCM ATTENDING ATTESTATION:  I have evaluated patient with the APP Varughese, reviewed database in its entirety and discussed care plan in detail. In addition, this patient was discussed on multidisciplinary rounds.   This is CAP, NOS with hypotension. Presently weaning off vasopressors. Not in respiratory distress. Once off vasopressors, she should be able to go safely to reg med floor  Merton Border, MD PCCM service Mobile [phone] Pager [phone]

## 2016-07-10 NOTE — Evaluation (Signed)
Physical Therapy Evaluation Patient Details Name: Sabrina Schmidt MRN: [id number] DOB: 06-03-1941 Today's Date: 07/10/2016   History of Present Illness  76 y.o. female with a known history of  Arthritis, hypertension, hyperlipidemia and osteoporosis presents to hospital secondary to worsening cough, congestion and weakness associated with fall yesterday.  Admitted with pneumonai/sepsis.  Clinical Impression  Pt is able to get to standing and do some minimal walking w/o AD and w/o direct assist but is not at all near her baseline and generally is very slow and guarded with the effort.  She would likely benefit from an AD in the short run (appears she has all the ADs she could need at home).  Pt assured PT that she has a friend that could be around essentially 24/7 and run her errands, etc until she is more back to baseline. Pt's BP stayed stable with activity.      Follow Up Recommendations Home health PT;Supervision/Assistance - 24 hour    Equipment Recommendations       Recommendations for Other Services       Precautions / Restrictions Precautions Precautions: Fall Restrictions Weight Bearing Restrictions: No      Mobility  Bed Mobility Overal bed mobility: Modified Independent             General bed mobility comments: Pt able to get herself to EOB w/o direct assist, somewhat labored but not overly reliant on rails  Transfers Overall transfer level: Modified independent Equipment used: None             General transfer comment: Pt slow and cautious getting to standing and had a minimal amount of unsteadiness, but ultimately was safe and did not need assist to get to standing.  Ambulation/Gait Ambulation/Gait assistance: Min guard Ambulation Distance (Feet): 65 Feet Assistive device: None       General Gait Details: Pt with very slow, cautious ambulation.  She did not have excessive fatigue with the effort, but was tired after walking.  Pt's O2 remained in the 90s  on 2 liters, but she was very guarded and slow - nothing like her baseline.   Stairs            Wheelchair Mobility    Modified Rankin (Stroke Patients Only)       Balance Overall balance assessment: Modified Independent                                           Pertinent Vitals/Pain      Home Living Family/patient expects to be discharged to:: Private residence Living Arrangements: Alone Available Help at Discharge: Friend(s);Available 24 hours/day Type of Home: House Home Access: Ramped entrance       Home Equipment: Walker - 2 wheels;Bedside commode;Cane - single point      Prior Function Level of Independence: Independent         Comments: Pt was very active, able to drive, run errands, involved with a lot of community activities     Hand Dominance        Extremity/Trunk Assessment   Upper Extremity Assessment Upper Extremity Assessment: Overall WFL for tasks assessed    Lower Extremity Assessment Lower Extremity Assessment: Overall WFL for tasks assessed       Communication   Communication: No difficulties  Cognition Arousal/Alertness: Awake/alert Behavior During Therapy: WFL for tasks assessed/performed Overall Cognitive Status: Within Functional Limits for  tasks assessed                      General Comments      Exercises     Assessment/Plan    PT Assessment Patient needs continued PT services  PT Problem List Decreased strength;Decreased range of motion;Decreased activity tolerance;Decreased balance;Decreased mobility;Decreased coordination;Decreased cognition;Decreased safety awareness;Decreased knowledge of use of DME          PT Treatment Interventions Gait training;Therapeutic activities;Therapeutic exercise;Functional mobility training;Balance training    PT Goals (Current goals can be found in the Care Plan section)  Acute Rehab PT Goals Patient Stated Goal: go home PT Goal Formulation: With  patient Time For Goal Achievement: 07/24/16 Potential to Achieve Goals: Good    Frequency Min 2X/week   Barriers to discharge        Co-evaluation               End of Session Equipment Utilized During Treatment: Gait belt Activity Tolerance: Patient tolerated treatment well Patient left: in chair;with call bell/phone within reach;with nursing/sitter in room Nurse Communication: Mobility status         Time: [id number] PT Time Calculation (min) (ACUTE ONLY): 25 min   Charges:   PT Evaluation $PT Eval Low Complexity: 1 Procedure     PT G CodesKreg Shropshire, DPT 07/10/2016, 10:27 AM

## 2016-07-11 ENCOUNTER — Inpatient Hospital Stay: Payer: Medicare Other

## 2016-07-11 LAB — BASIC METABOLIC PANEL
Anion gap: 3 — ABNORMAL LOW (ref 5–15)
BUN: 23 mg/dL — AB (ref 6–20)
CHLORIDE: 111 mmol/L (ref 101–111)
CO2: 20 mmol/L — AB (ref 22–32)
CREATININE: 1.13 mg/dL — AB (ref 0.44–1.00)
Calcium: 6.9 mg/dL — ABNORMAL LOW (ref 8.9–10.3)
GFR calc Af Amer: 54 mL/min — ABNORMAL LOW (ref >60)
GFR calc non Af Amer: 46 mL/min — ABNORMAL LOW (ref >60)
Glucose, Bld: 105 mg/dL — ABNORMAL HIGH (ref 65–99)
Potassium: 4.3 mmol/L (ref 3.5–5.1)
SODIUM: 134 mmol/L — AB (ref 135–145)

## 2016-07-11 LAB — CBC
HCT: 31 % — ABNORMAL LOW (ref 35.0–47.0)
Hemoglobin: 10.5 g/dL — ABNORMAL LOW (ref 12.0–16.0)
MCH: 29.9 pg (ref 26.0–34.0)
MCHC: 33.9 g/dL (ref 32.0–36.0)
MCV: 88.2 fL (ref 80.0–100.0)
PLATELETS: 117 10*3/uL — AB (ref 150–440)
RBC: 3.51 MIL/uL — ABNORMAL LOW (ref 3.80–5.20)
RDW: 13.2 % (ref 11.5–14.5)
WBC: 4.5 10*3/uL (ref 3.6–11.0)

## 2016-07-11 LAB — PROCALCITONIN: Procalcitonin: 8.63 ng/mL

## 2016-07-11 NOTE — Progress Notes (Signed)
Pt was weaned off of vasopressors and transferred out of ICU. Her CXR continues to show R basilar PNA. No organism has been identified. Her PCT has dropped significantly. Renal function is normalizing. PCCM will sign off. Please call if we can be of further assistance  Merton Border, MD PCCM service Mobile [phone] Pager [phone] 07/11/2016

## 2016-07-11 NOTE — Progress Notes (Signed)
[facility] at [place] NAME: Sabrina Schmidt    MR#:  [id number]  DATE OF BIRTH:  1941/04/17  SUBJECTIVE:  Came in with cold and congestion and found to have sepsis with hypotesnion and pneumonia -family in the room. Feels a lot bette today BP improving  REVIEW OF SYSTEMS:   Review of Systems  Constitutional: Negative for chills, fever and weight loss.  HENT: Negative for ear discharge, ear pain and nosebleeds.   Eyes: Negative for blurred vision, pain and discharge.  Respiratory: Positive for shortness of breath. Negative for sputum production, wheezing and stridor.   Cardiovascular: Negative for chest pain, palpitations, orthopnea and PND.  Gastrointestinal: Negative for abdominal pain, diarrhea, nausea and vomiting.  Genitourinary: Negative for frequency and urgency.  Musculoskeletal: Negative for back pain and joint pain.  Neurological: Positive for weakness. Negative for sensory change, speech change and focal weakness.  Psychiatric/Behavioral: Negative for depression and hallucinations. The patient is not nervous/anxious.    Tolerating Diet:yes Tolerating PT: HHPT  DRUG ALLERGIES:   Allergies  Allergen Reactions  . Ivp Dye [Iodinated Diagnostic Agents] Rash    VITALS:  Blood pressure 100/78, pulse 94, temperature 99.3 F (37.4 C), temperature source Oral, resp. rate 20, height 5' (1.524 m), weight 65.8 kg (145 lb), SpO2 95 %.  PHYSICAL EXAMINATION:   Physical Exam  GENERAL:  76 y.o.-year-old patient lying in the bed with no acute distress.  EYES: Pupils equal, round, reactive to light and accommodation. No scleral icterus. Extraocular muscles intact.  HEENT: Head atraumatic, normocephalic. Oropharynx and nasopharynx clear.  NECK:  Supple, no jugular venous distention. No thyroid enlargement, no tenderness.  LUNGS: Normal breath sounds bilaterally, no wheezing, rales, rhonchi. No use of accessory muscles of respiration.   CARDIOVASCULAR: S1, S2 normal. No murmurs, rubs, or gallops.  ABDOMEN: Soft, nontender, nondistended. Bowel sounds present. No organomegaly or mass.  EXTREMITIES: No cyanosis, clubbing or edema b/l.    NEUROLOGIC: Cranial nerves II through XII are intact. No focal Motor or sensory deficits b/l.   PSYCHIATRIC:  patient is alert and oriented x 3.  SKIN: No obvious rash, lesion, or ulcer.   LABORATORY PANEL:  CBC  Recent Labs Lab 07/11/16 0419  WBC 4.5  HGB 10.5*  HCT 31.0*  PLT 117*    Chemistries   Recent Labs Lab 07/09/16 1323  07/11/16 0419  NA 132*  < > 134*  K 2.1*  < > 4.3  CL 94*  < > 111  CO2 25  < > 20*  GLUCOSE 161*  < > 105*  BUN 30*  < > 23*  CREATININE 1.67*  < > 1.13*  CALCIUM 8.5*  < > 6.9*  MG 1.6*  --   --   < > = values in this interval not displayed. Cardiac Enzymes No results for input(s): TROPONINI in the last 168 hours. RADIOLOGY:  Dg Chest 2 View  Result Date: 07/09/2016 CLINICAL DATA:  Productive cough, congestion for 3 weeks EXAM: CHEST  2 VIEW COMPARISON:  05/20/2010 FINDINGS: Cardiomediastinal silhouette is stable. There is infiltrate/pneumonia in right lower lobe anterolateral segment. Follow-up to resolution after appropriate treatment is recommended. Left lung is clear. No pulmonary edema. IMPRESSION: Infiltrate/pneumonia in right lower lobe anterolateral. Followup PA and lateral chest X-ray is recommended in 3-4 weeks following trial of antibiotic therapy to ensure resolution and exclude underlying malignancy. Electronically Signed   By: Lahoma Crocker M.D.   On: 07/09/2016 13:58  Ct Head Wo Contrast  Result Date: 07/09/2016 CLINICAL DATA:  Slurred speech. Generalized weakness with fall yesterday. EXAM: CT HEAD WITHOUT CONTRAST TECHNIQUE: Contiguous axial images were obtained from the base of the skull through the vertex without intravenous contrast. COMPARISON:  10/11/2013 brain MRI. FINDINGS: Brain: No evidence of parenchymal hemorrhage or  extra-axial fluid collection. No mass lesion, mass effect, or midline shift. No CT evidence of acute infarction. Slight symmetric prominence of the CSF spaces over the bifrontal convexities appears unchanged since 10/11/2013 brain MRI and is compatible with mild generalized cerebral volume loss. No ventriculomegaly. Vascular: No hyperdense vessel or unexpected calcification. Skull: No evidence of calvarial fracture. Sinuses/Orbits: The visualized paranasal sinuses are essentially clear. Other:  The mastoid air cells are unopacified. IMPRESSION: 1. No evidence of acute intracranial abnormality. No evidence of calvarial fracture. 2. Mild generalized cerebral volume loss. Electronically Signed   By: Ilona Sorrel M.D.   On: 07/09/2016 19:16   Dg Chest Port 1 View  Result Date: 07/11/2016 CLINICAL DATA:  Respiratory failure. EXAM: PORTABLE CHEST 1 VIEW COMPARISON:  Radiographs of July 09, 2016. FINDINGS: Stable cardiomediastinal silhouette. No pneumothorax is noted. Stable right middle lobe opacity consistent with pneumonia. Increased left basilar opacity is noted concerning for worsening infiltrate or atelectasis. No pleural effusion is noted. Bony thorax is unremarkable. IMPRESSION: Stable right middle lobe opacity consistent with pneumonia. Increased left basilar opacity is noted concerning for worsening infiltrate or atelectasis. Electronically Signed   By: Marijo Conception, M.D.   On: 07/11/2016 07:33   ASSESSMENT AND PLAN:  76 y.o female with past medical history of hypertension, arthritis, hyperlipidemia and osteoporosis presented to the hospital with cough, congestion and weakness associated with a fall 2 days ago. Found to have right lower lobe community acquired pneumonia.   1) Sepsis - secondary to right lower lobe community acquired pneumonia - Continue IV Rocephin and azithromycin--Will change to oral on the buttocks tomorrow. - Follow up blood cultures negative - Cough: continue tessalon and  Tussionex. added lozenges for supportive measures - Begin incentive spirometry -pt was on IV phenyephrine for hypotention---weaned as bp improved  2) Fall - with right sided pain - secondary to weakness.  - Head CT negative - Follow up with physical therapy  3) Acute renal failure - likely ATN from sepsis -  BUN 31 from 30; Creatinine 1.28 from 1.70 - Continue IVF hydration   4) Hypokalemia - resolved. - Potassium levels 4.4 today from 2.1 on admission   5) GERD - per patient request, added Pepcid 20mg  daily by mouth at bedtime   6) Hyperlipidemia - Continue statin  Continues to show improvement for discharge tomorrow. Patient agreeable.  D/w pt and dter  Case discussed with Care Management/Social Worker. Management plans discussed with the patient, family and they are in agreement.  CODE STATUS:FULL  DVT Prophylaxis: *lovenox  TOTAL lTIME TAKING CARE OF THIS PATIENT: 29minutes.  >50% time spent on counselling and coordination of care  POSSIBLE D/C IN 1-2 DAYS, DEPENDING ON CLINICAL CONDITION.  Note: This dictation was prepared with Dragon dictation along with smaller phrase technology. Any transcriptional errors that result from this process are unintentional.  Pryce Folts M.D on 07/11/2016 at 12:34 PM  Between 7am to 6pm - Pager - [phone]  After 6pm go to [email or web address] - Proofreader  [facility]  Office  [phone]  CC: Primary care physician; WHITE, Orlene Och, NP

## 2016-07-11 NOTE — Progress Notes (Signed)
Pt up to chair, RT weaned pt down to 1L [place] and RT gave incentive spirometer

## 2016-07-12 MED ORDER — TRAMADOL HCL 50 MG PO TABS: 50.0000 mg | ORAL_TABLET | Freq: Three times a day (TID) | ORAL | 0 refills | Status: AC | PRN

## 2016-07-12 MED ORDER — AZITHROMYCIN 250 MG PO TABS: ORAL_TABLET | ORAL | 0 refills | Status: DC

## 2016-07-12 MED ORDER — CEFUROXIME AXETIL 500 MG PO TABS: 500.0000 mg | ORAL_TABLET | Freq: Two times a day (BID) | ORAL | 0 refills | Status: DC

## 2016-07-12 MED ORDER — CEFUROXIME AXETIL 500 MG PO TABS
500.0000 mg | ORAL_TABLET | Freq: Two times a day (BID) | ORAL | Status: DC
  Administered 2016-07-12: 500 mg via ORAL
  Filled 2016-07-12: qty 1

## 2016-07-12 MED ORDER — AZITHROMYCIN 250 MG PO TABS
250.0000 mg | ORAL_TABLET | Freq: Every day | ORAL | Status: DC
  Administered 2016-07-12: 250 mg via ORAL
  Filled 2016-07-12: qty 1

## 2016-07-12 NOTE — Progress Notes (Signed)
SATURATION QUALIFICATIONS: (This note is used to comply with regulatory documentation for home oxygen)  Patient Saturations on Room Air at Rest = 84 %  Patient Saturations on Room Air while Ambulating = 80 %  Patient Saturations on 3 Liters of oxygen while Ambulating = 90%  Please briefly explain why patient needs home oxygen:  Severe pneumonia. Mrs W[place]om agrees to self-pay for home oxygen provided by [facility]. 02 Saturations obtained by Siri Cole, RN on 07/12/16 at 9am.

## 2016-07-12 NOTE — Progress Notes (Signed)
Pt being discharged home with home health with o2, discharge instructions and prescription reviewed with pt, states understanding, pt with no complaints, no distress or discomfort noted

## 2016-07-12 NOTE — Progress Notes (Addendum)
SATURATION QUALIFICATIONS: (This note is used to comply with regulatory documentation for home oxygen)  Patient Saturations on Room Air at Rest = 84%  Patient Saturations on Room Air while Ambulating = 80%  Patient Saturations on 3 Liters of oxygen while Ambulating = 90%

## 2016-07-12 NOTE — Discharge Summary (Signed)
[facility] at [place] NAME: Sabrina Schmidt    MR#:  [id number]  DATE OF BIRTH:  1940-12-26  DATE OF ADMISSION:  07/09/2016 ADMITTING PHYSICIAN: Gladstone Lighter, MD  DATE OF DISCHARGE: 07/12/16  PRIMARY CARE PHYSICIAN: WHITE, Orlene Och, NP    ADMISSION DIAGNOSIS:  Hypokalemia [E87.6] Hypomagnesemia [E83.42] AKI (acute kidney injury) ([place]) [N17.9] Community acquired pneumonia of right lung, unspecified part of lung [J18.9]  DISCHARGE DIAGNOSIS:  Right LL Pneumonia Sepsis due to Pneumonia hypokalemia SECONDARY DIAGNOSIS:   Past Medical History:  Diagnosis Date  . Arthritis    knees, back  . Epitrochlear bursitis   . Hypercholesterolemia   . Hypertension   . 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 Osteopenia   . Vitamin D deficiency     HOSPITAL COURSE:   76 y.o female with past medical history of hypertension, arthritis, hyperlipidemia and osteoporosis presented to the hospital with cough, congestion and weakness associated with a fall 2 days ago. Found to have right lower lobe community acquired pneumonia.   1) Sepsis - secondary to right lower lobe community acquired pneumonia - Continue IV Rocephin and azithromycin--Will change to oral abxs today - Follow up blood cultures negative - Cough: continue tessalon and Tussionex. added lozenges for supportive measures - Begin incentive spirometry -pt was on IV phenyephrine for hypotention---weaned  off as bp improved -pt qualifies for home oxygen  2) Fall - with right sided pain - secondary to weakness.  - Head CT negative -PT recommends  3) Acute renal failure - likely ATN from sepsis - BUN 31 from 30; Creatinine 1.28 from 1.70 -recieved IVF hydration   4) Hypokalemia - resolved. - Potassium levels 4.4 today from 2.1 on admission   5) GERD - per patient request, added Pepcid 20mg  daily by mouth at bedtime   6) Hyperlipidemia - Continue statin  Pt  qualiies for home oxygen.  HHPT  D/w pt and dter  CONSULTS OBTAINED:    DRUG ALLERGIES:   Allergies  Allergen Reactions  . Ivp Dye [Iodinated Diagnostic Agents] Rash    DISCHARGE MEDICATIONS:   Current Discharge Medication List    START taking these medications   Details  azithromycin (ZITHROMAX) 250 MG tablet Take daily as directed Qty: 4 each, Refills: 0    cefUROXime (CEFTIN) 500 MG tablet Take 1 tablet (500 mg total) by mouth 2 (two) times daily with a meal. Qty: 10 tablet, Refills: 0    traMADol (ULTRAM) 50 MG tablet Take 1 tablet (50 mg total) by mouth every 8 (eight) hours as needed for moderate pain. Qty: 25 tablet, Refills: 0      CONTINUE these medications which have NOT CHANGED   Details  atorvastatin (LIPITOR) 40 MG tablet Take 40 mg by mouth daily.    cetirizine (ZYRTEC) 10 MG tablet Take 10 mg by mouth daily as needed for allergies.    hydrochlorothiazide (HYDRODIURIL) 25 MG tablet Take 25 mg by mouth daily as needed.         If you experience worsening of your admission symptoms, develop shortness of breath, life threatening emergency, suicidal or homicidal thoughts you must seek medical attention immediately by calling 911 or calling your MD immediately  if symptoms less severe.  You Must read complete instructions/literature along with all the possible adverse reactions/side effects for all the Medicines you take and that have been prescribed to you. Take any new Medicines after you have completely understood  and accept all the possible adverse reactions/side effects.   Please note  You were cared for by a hospitalist during your hospital stay. If you have any questions about your discharge medications or the care you received while you were in the hospital after you are discharged, you can call the unit and asked to speak with the hospitalist on call if the hospitalist that took care of you is not available. Once you are discharged, your primary  care physician will handle any further medical issues. Please note that NO REFILLS for any discharge medications will be authorized once you are discharged, as it is imperative that you return to your primary care physician (or establish a relationship with a primary care physician if you do not have one) for your aftercare needs so that they can reassess your need for medications and monitor your lab values. Today   SUBJECTIVE   Cough+  VITAL SIGNS:  Blood pressure (!) 139/59, pulse 89, temperature 99.3 F (37.4 C), temperature source Oral, resp. rate (!) 23, height 5' (1.524 m), weight 65.8 kg (145 lb), SpO2 90 %.  I/O:   Intake/Output Summary (Last 24 hours) at 07/12/16 0948 Last data filed at 07/12/16 0943  Gross per 24 hour  Intake           781.33 ml  Output              200 ml  Net           581.33 ml    PHYSICAL EXAMINATION:  GENERAL:  76 y.o.-year-old patient lying in the bed with no acute distress.  EYES: Pupils equal, round, reactive to light and accommodation. No scleral icterus. Extraocular muscles intact.  HEENT: Head atraumatic, normocephalic. Oropharynx and nasopharynx clear.  NECK:  Supple, no jugular venous distention. No thyroid enlargement, no tenderness.  LUNGS: Normal breath sounds bilaterally, no wheezing -+ RLL rales, scattered rhonchi, no crepitation. No use of accessory muscles of respiration.  CARDIOVASCULAR: S1, S2 normal. No murmurs, rubs, or gallops.  ABDOMEN: Soft, non-tender, non-distended. Bowel sounds present. No organomegaly or mass.  EXTREMITIES: No pedal edema, cyanosis, or clubbing.  NEUROLOGIC: Cranial nerves II through XII are intact. Muscle strength 5/5 in all extremities. Sensation intact. Gait not checked.  PSYCHIATRIC: The patient is alert and oriented x 3.  SKIN: No obvious rash, lesion, or ulcer.   DATA REVIEW:   CBC   Recent Labs Lab 07/11/16 0419  WBC 4.5  HGB 10.5*  HCT 31.0*  PLT 117*    Chemistries   Recent Labs Lab  07/09/16 1323  07/11/16 0419  NA 132*  < > 134*  K 2.1*  < > 4.3  CL 94*  < > 111  CO2 25  < > 20*  GLUCOSE 161*  < > 105*  BUN 30*  < > 23*  CREATININE 1.67*  < > 1.13*  CALCIUM 8.5*  < > 6.9*  MG 1.6*  --   --   < > = values in this interval not displayed.  Microbiology Results   Recent Results (from the past 240 hour(s))  Blood culture (routine x 2)     Status: None (Preliminary result)   Collection Time: 07/09/16  2:46 PM  Result Value Ref Range Status   Specimen Description BLOOD RIGHT FA  Final   Special Requests   Final    BOTTLES DRAWN AEROBIC AND ANAEROBIC AER 6ML ANA 5ML   Culture NO GROWTH 2 DAYS  Final  Report Status PENDING  Incomplete  Blood culture (routine x 2)     Status: None (Preliminary result)   Collection Time: 07/09/16  2:46 PM  Result Value Ref Range Status   Specimen Description BLOOD UNKNOWN  Final   Special Requests BOTTLES DRAWN AEROBIC AND ANAEROBIC 6NL  Final   Culture NO GROWTH 2 DAYS  Final   Report Status PENDING  Incomplete  MRSA PCR Screening     Status: None   Collection Time: 07/10/16  3:34 AM  Result Value Ref Range Status   MRSA by PCR NEGATIVE NEGATIVE Final    Comment:        The GeneXpert MRSA Assay (FDA approved for NASAL specimens only), is one component of a comprehensive MRSA colonization surveillance program. It is not intended to diagnose MRSA infection nor to guide or monitor treatment for MRSA infections.     RADIOLOGY:  Dg Chest Port 1 View  Result Date: 07/11/2016 CLINICAL DATA:  Respiratory failure. EXAM: PORTABLE CHEST 1 VIEW COMPARISON:  Radiographs of July 09, 2016. FINDINGS: Stable cardiomediastinal silhouette. No pneumothorax is noted. Stable right middle lobe opacity consistent with pneumonia. Increased left basilar opacity is noted concerning for worsening infiltrate or atelectasis. No pleural effusion is noted. Bony thorax is unremarkable. IMPRESSION: Stable right middle lobe opacity consistent with  pneumonia. Increased left basilar opacity is noted concerning for worsening infiltrate or atelectasis. Electronically Signed   By: Marijo Conception, M.D.   On: 07/11/2016 07:33     Management plans discussed with the patient, family and they are in agreement.  CODE STATUS:     Code Status Orders        Start     Ordered   07/09/16 1642  Full code  Continuous     07/09/16 1641    Code Status History    Date Active Date Inactive Code Status Order ID Comments User Context   This patient has a current code status but no historical code status.      TOTAL TIME TAKING CARE OF THIS PATIENT: 40 minutes.    Bernd Crom M.D on 07/12/2016 at 9:48 AM  Between 7am to 6pm - Pager - [phone] After 6pm go to [email or web address] - Proofreader  [facility]  Office  [phone]  CC: Primary care physician; WHITE, Orlene Och, NP

## 2016-07-12 NOTE — Care Management Note (Signed)
Case Management Note  Patient Details  Name: Sabrina Schmidt MRN: [id number] Date of Birth: 02-24-1941  Subjective/Objective:           A request for home health PT, RN, and Respiratory Care was called to [facility]. A request for self-pay new oxygen was also called to [facility] per Mrs Erdahl's diagnosis of PNA does not qualify for Medicare home oxygen and she agreed to pay the $250 to $300 monthly rental free for continuous home oxygen out-of-pocket. A portable 02 tank will be delivered to Mrs Ciaravino today in her [facility], and new home oxygen will be set up at her home. Mrs Ehrgott is ready for discharge home after her portable oxygen tank is delivered to her hospital room today. No other discharge needs identified.           Action/Plan:   Expected Discharge Date:  07/12/16               Expected Discharge Plan:   (Too early in process to identify a expected discahrge plan)  In-House Referral:   [facility] and new oxygen  Discharge planning Services     Post Acute Care Choice:    Choice offered to:     DME Arranged:   New oxygen DME Agency:   [facility]  HH Arranged:   PT, RN, Respiratory care [place] [facility]:   [facility]  Status of Service:   discharge today  If discussed at [place] of Stay Meetings, dates discussed:    Additional Comments:  Junie Engram A, RN 07/12/2016, 10:27 AM

## 2016-07-12 NOTE — Care Management Note (Signed)
Case Management Note  Patient Details  Name: Sabrina Schmidt MRN: [id number] Date of Birth: Dec 10, 1940  Subjective/Objective:  Discussed discharge planning with Dr Fritzi Mandes and with Mrs Karber. Mrs Cripps does not have a chronic respiratory diagnosis and therefore does not meet Medicare guidelines for home oxygen. Mrs Schieffer has agreed to pay out-of-pocket $250-$300 monthly rental rate for home oxygen from [employer]. A new oxygen order and home health PT and RN orders were called to [facility]. This Probation officer advised Mrs Akpan and Estill Bamberg RN that this Probation officer does not have an ETA at this time.                   Action/Plan:   Expected Discharge Date:  07/12/16               Expected Discharge Plan:   (Too early in process to identify a expected discahrge plan)  In-House Referral:     Discharge planning Services     Post Acute Care Choice:    Choice offered to:     DME Arranged:    DME Agency:     HH Arranged:    HH Agency:     Status of Service:     If discussed at [employer], dates discussed:    Additional Comments:  Meria Crilly A, RN 07/12/2016, 9:41 AM

## 2016-07-14 LAB — CULTURE, BLOOD (ROUTINE X 2)
CULTURE: NO GROWTH
CULTURE: NO GROWTH

## 2018-01-04 ENCOUNTER — Other Ambulatory Visit: Payer: Self-pay | Admitting: Family Medicine

## 2018-01-04 DIAGNOSIS — Z1231 Encounter for screening mammogram for malignant neoplasm of breast: Secondary | ICD-10-CM

## 2018-01-20 ENCOUNTER — Ambulatory Visit
Admission: RE | Admit: 2018-01-20 | Discharge: 2018-01-20 | Disposition: A | Discharge status: Home / Self Care | Payer: Medicare Other | Source: Ambulatory Visit | Attending: Family Medicine | Admitting: Family Medicine

## 2018-01-20 DIAGNOSIS — Z1231 Encounter for screening mammogram for malignant neoplasm of breast: Secondary | ICD-10-CM

## 2018-02-16 ENCOUNTER — Other Ambulatory Visit: Payer: Self-pay | Admitting: Family Medicine

## 2018-02-16 DIAGNOSIS — R928 Other abnormal and inconclusive findings on diagnostic imaging of breast: Secondary | ICD-10-CM

## 2018-02-16 DIAGNOSIS — N6310 Unspecified lump in the right breast, unspecified quadrant: Secondary | ICD-10-CM

## 2018-03-01 ENCOUNTER — Ambulatory Visit
Admission: RE | Admit: 2018-03-01 | Discharge: 2018-03-01 | Disposition: A | Discharge status: Home / Self Care | Payer: Medicare Other | Source: Ambulatory Visit | Attending: Family Medicine | Admitting: Family Medicine

## 2018-03-01 DIAGNOSIS — N6310 Unspecified lump in the right breast, unspecified quadrant: Secondary | ICD-10-CM | POA: Diagnosis present

## 2018-03-01 DIAGNOSIS — R928 Other abnormal and inconclusive findings on diagnostic imaging of breast: Secondary | ICD-10-CM | POA: Insufficient documentation

## 2018-03-26 IMAGING — CT CT HEAD W/O CM
3 series · 14 of 47 positions shown, 16 images · non-contrast
Comparison: 10/11/2013 brain MRI.

CLINICAL DATA: Slurred speech. Generalized weakness with fall
yesterday.

EXAM:
CT HEAD WITHOUT CONTRAST
TECHNIQUE: Contiguous axial images were obtained from the base of the skull
through the vertex without intravenous contrast.

[Series 2: head wo · axial · 0.47mm/px · z∈[-133,-8]mm · 8 of 30 slices shown, 10 images]
[im 3/30  brain]
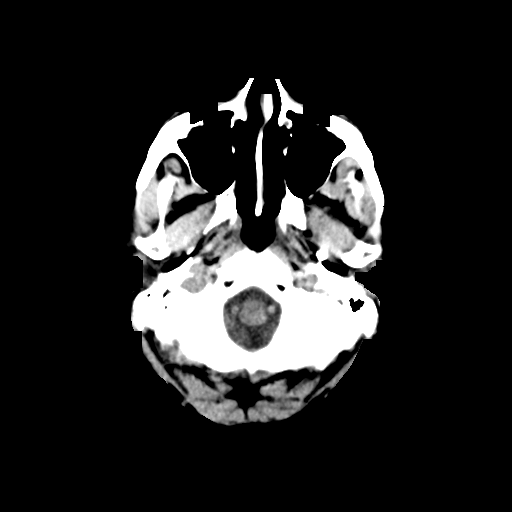
[im 3/30  bone]
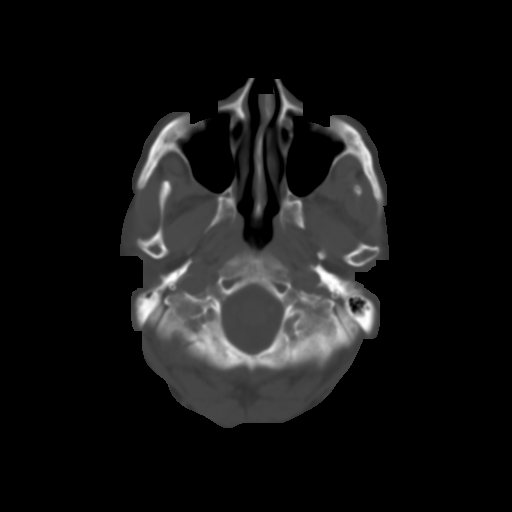
[im 7/30  brain]
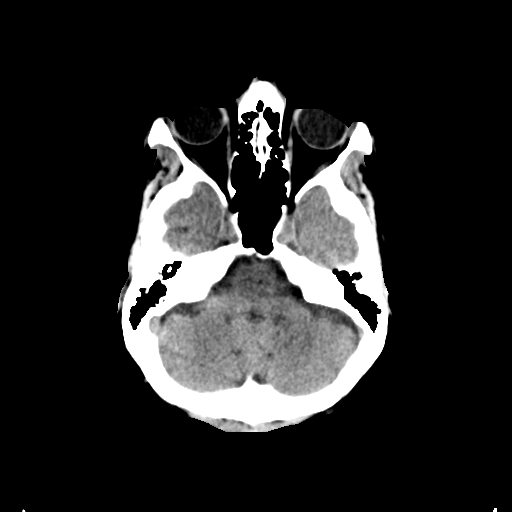
[im 10/30  brain]
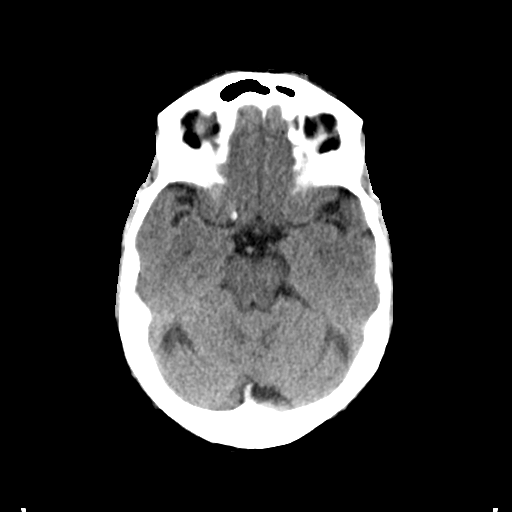
[im 14/30  brain]
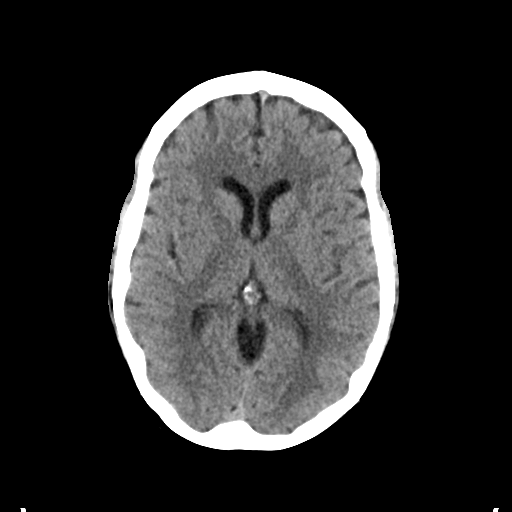
[im 17/30  brain]
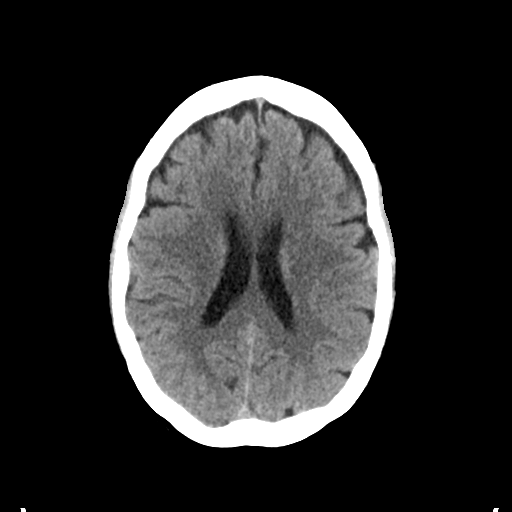
[im 17/30  bone]
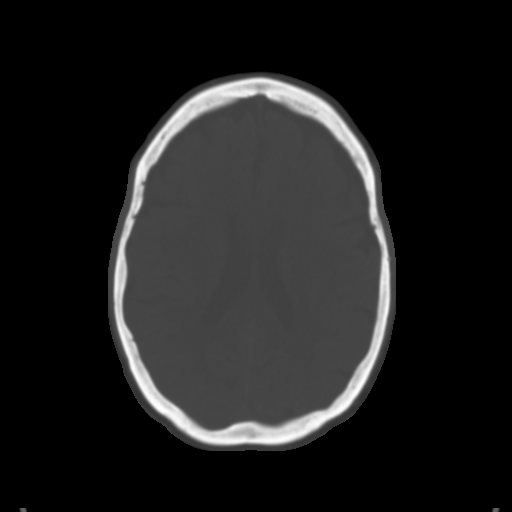
[im 21/30  brain]
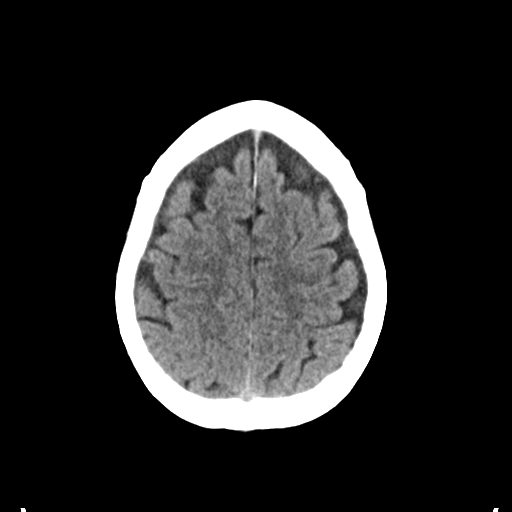
[im 24/30  brain]
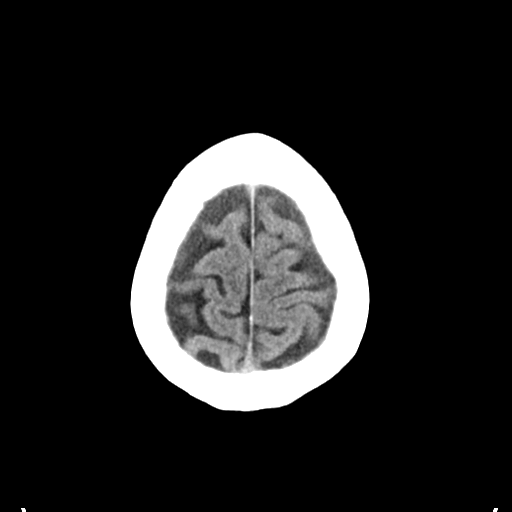
[im 28/30  brain]
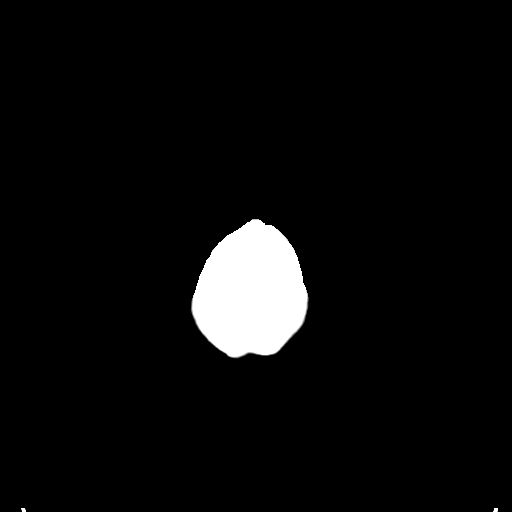

[Series 4: coronal soft tissue · coronal · 0.29mm/px · 3 of 58 slices shown]
[im 20/58  brain]
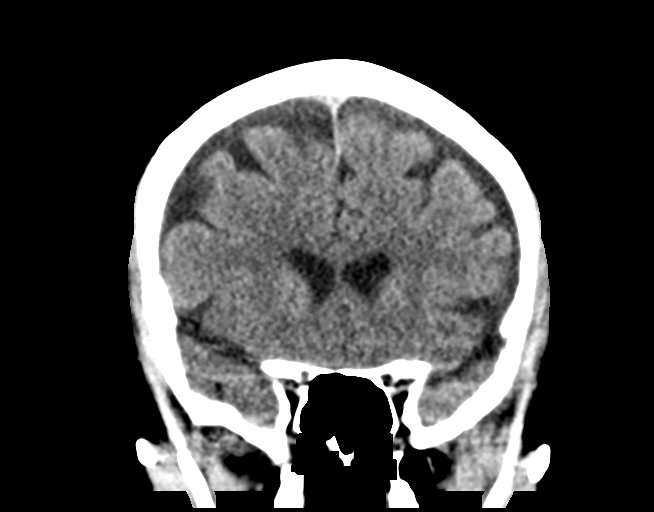
[im 26/58  brain]
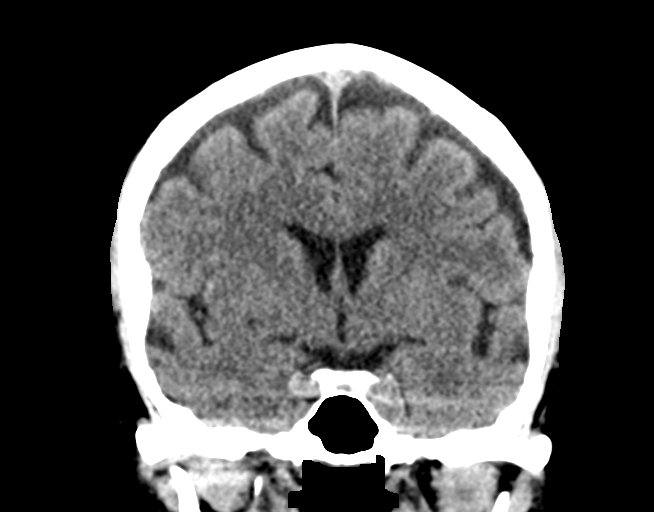
[im 32/58  brain]
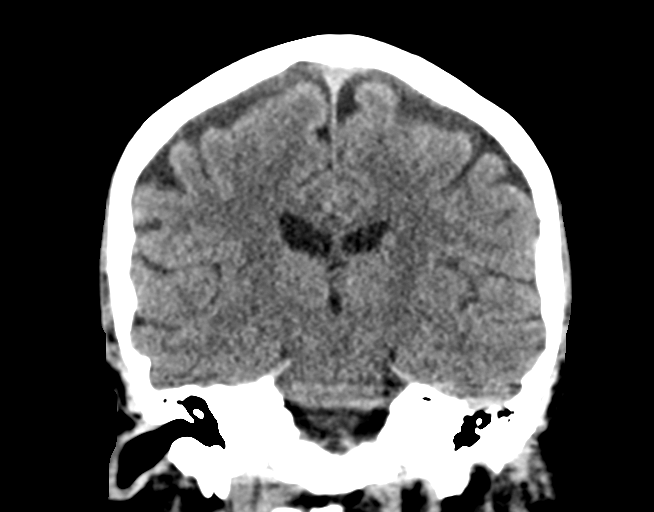

[Series 5: sagittal soft tissue · sagittal · 0.28mm/px · 3 of 48 slices shown]
[im 16/48  brain]
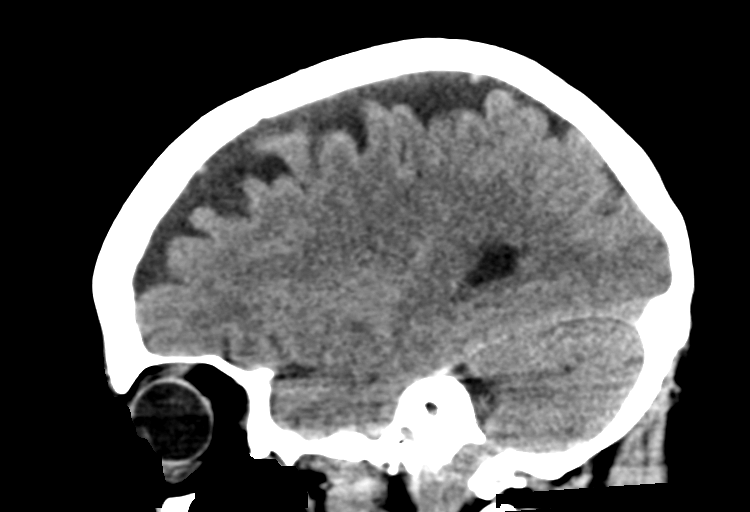
[im 24/48  brain]
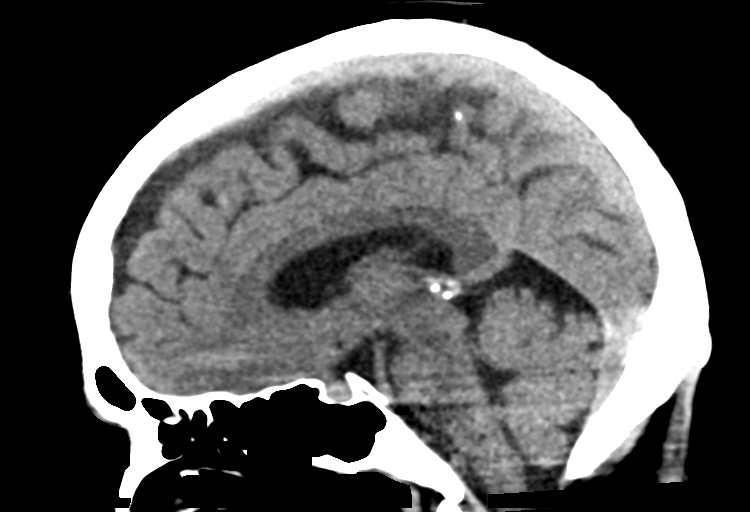
[im 32/48  brain]
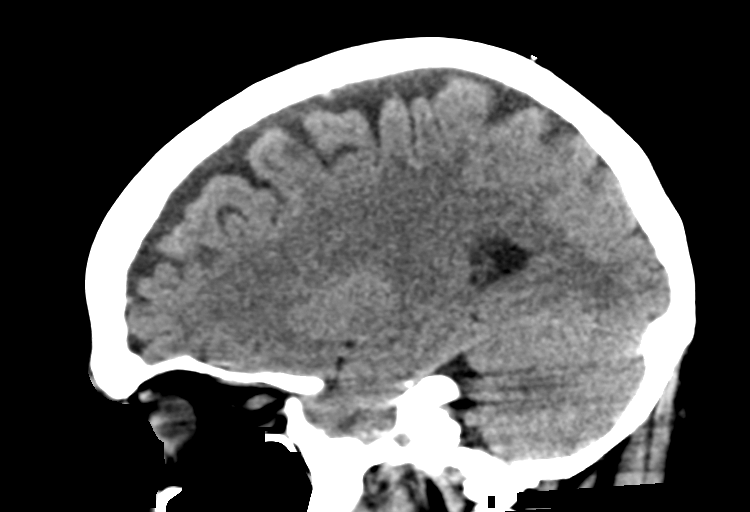

[14 of 47 positions shown; findings below may reference images not displayed]

FINDINGS: Brain: No evidence of parenchymal hemorrhage or extra-axial fluid
collection. No mass lesion, mass effect, or midline shift. No CT
evidence of acute infarction. Slight symmetric prominence of the CSF
spaces over the bifrontal convexities appears unchanged since
10/11/2013 brain MRI and is compatible with mild generalized
cerebral volume loss. No ventriculomegaly.

Vascular: No hyperdense vessel or unexpected calcification.

Skull: No evidence of calvarial fracture.

Sinuses/Orbits: The visualized paranasal sinuses are essentially
clear.

Other:  The mastoid air cells are unopacified.
IMPRESSION: 1. No evidence of acute intracranial abnormality. No evidence of
calvarial fracture.
2. Mild generalized cerebral volume loss.

## 2018-07-29 ENCOUNTER — Encounter: Payer: Self-pay | Admitting: *Deleted

## 2018-08-01 ENCOUNTER — Ambulatory Visit: Payer: Medicare Other | Admitting: Anesthesiology

## 2018-08-01 ENCOUNTER — Encounter
Admission: RE | Disposition: A | Discharge status: Home / Self Care | Payer: Self-pay | Source: Home / Self Care | Attending: Gastroenterology

## 2018-08-01 ENCOUNTER — Other Ambulatory Visit: Payer: Self-pay

## 2018-08-01 ENCOUNTER — Ambulatory Visit
Admission: RE | Admit: 2018-08-01 | Discharge: 2018-08-01 | Disposition: A | Discharge status: Home / Self Care | Payer: Medicare Other | Attending: Gastroenterology | Admitting: Gastroenterology

## 2018-08-01 DIAGNOSIS — Z96641 Presence of right artificial hip joint: Secondary | ICD-10-CM | POA: Diagnosis not present

## 2018-08-01 DIAGNOSIS — Z9841 Cataract extraction status, right eye: Secondary | ICD-10-CM | POA: Insufficient documentation

## 2018-08-01 DIAGNOSIS — M8580 Other specified disorders of bone density and structure, unspecified site: Secondary | ICD-10-CM | POA: Diagnosis not present

## 2018-08-01 DIAGNOSIS — Z86010 Personal history of colonic polyps: Secondary | ICD-10-CM | POA: Insufficient documentation

## 2018-08-01 DIAGNOSIS — N189 Chronic kidney disease, unspecified: Secondary | ICD-10-CM | POA: Diagnosis not present

## 2018-08-01 DIAGNOSIS — M170 Bilateral primary osteoarthritis of knee: Secondary | ICD-10-CM | POA: Insufficient documentation

## 2018-08-01 DIAGNOSIS — E7800 Pure hypercholesterolemia, unspecified: Secondary | ICD-10-CM | POA: Insufficient documentation

## 2018-08-01 DIAGNOSIS — D124 Benign neoplasm of descending colon: Secondary | ICD-10-CM | POA: Diagnosis not present

## 2018-08-01 DIAGNOSIS — Z1211 Encounter for screening for malignant neoplasm of colon: Secondary | ICD-10-CM | POA: Insufficient documentation

## 2018-08-01 DIAGNOSIS — Z9842 Cataract extraction status, left eye: Secondary | ICD-10-CM | POA: Diagnosis not present

## 2018-08-01 DIAGNOSIS — D123 Benign neoplasm of transverse colon: Secondary | ICD-10-CM | POA: Diagnosis not present

## 2018-08-01 DIAGNOSIS — Z886 Allergy status to analgesic agent status: Secondary | ICD-10-CM | POA: Diagnosis not present

## 2018-08-01 DIAGNOSIS — D125 Benign neoplasm of sigmoid colon: Secondary | ICD-10-CM | POA: Insufficient documentation

## 2018-08-01 DIAGNOSIS — Z79899 Other long term (current) drug therapy: Secondary | ICD-10-CM | POA: Diagnosis not present

## 2018-08-01 DIAGNOSIS — I129 Hypertensive chronic kidney disease with stage 1 through stage 4 chronic kidney disease, or unspecified chronic kidney disease: Secondary | ICD-10-CM | POA: Insufficient documentation

## 2018-08-01 DIAGNOSIS — Z961 Presence of intraocular lens: Secondary | ICD-10-CM | POA: Insufficient documentation

## 2018-08-01 DIAGNOSIS — K5730 Diverticulosis of large intestine without perforation or abscess without bleeding: Secondary | ICD-10-CM | POA: Diagnosis not present

## 2018-08-01 DIAGNOSIS — Z800 Family history of malignant neoplasm of digestive organs: Secondary | ICD-10-CM | POA: Insufficient documentation

## 2018-08-01 DIAGNOSIS — M479 Spondylosis, unspecified: Secondary | ICD-10-CM | POA: Insufficient documentation

## 2018-08-01 HISTORY — DX: Chronic kidney disease, unspecified: N18.9

## 2018-08-01 HISTORY — PX: COLONOSCOPY WITH PROPOFOL: SHX5780

## 2018-08-01 SURGERY — COLONOSCOPY WITH PROPOFOL
Anesthesia: General

## 2018-08-01 MED ORDER — PROPOFOL 500 MG/50ML IV EMUL
INTRAVENOUS | Status: AC
  Filled 2018-08-01: qty 50

## 2018-08-01 MED ORDER — SODIUM CHLORIDE 0.9 % IV SOLN
INTRAVENOUS | Status: DC
  Administered 2018-08-01: 1000 mL via INTRAVENOUS

## 2018-08-01 MED ORDER — LIDOCAINE HCL (PF) 1 % IJ SOLN
INTRAMUSCULAR | Status: AC
  Administered 2018-08-01: 0.3 mL
  Filled 2018-08-01: qty 2

## 2018-08-01 MED ORDER — PROPOFOL 500 MG/50ML IV EMUL
INTRAVENOUS | Status: DC | PRN
  Administered 2018-08-01: 150 ug/kg/min via INTRAVENOUS

## 2018-08-01 MED ORDER — FENTANYL CITRATE (PF) 100 MCG/2ML IJ SOLN
INTRAMUSCULAR | Status: DC | PRN
  Administered 2018-08-01: 25 ug via INTRAVENOUS

## 2018-08-01 MED ORDER — PROPOFOL 10 MG/ML IV BOLUS
INTRAVENOUS | Status: DC | PRN
  Administered 2018-08-01 (×2): 40 mg via INTRAVENOUS

## 2018-08-01 MED ORDER — FENTANYL CITRATE (PF) 100 MCG/2ML IJ SOLN
INTRAMUSCULAR | Status: AC
  Filled 2018-08-01: qty 2

## 2018-08-01 NOTE — Anesthesia Preprocedure Evaluation (Signed)
Anesthesia Evaluation  Patient identified by MRN, date of birth, ID band Patient awake    Reviewed: Allergy & Precautions, H&P , NPO status , Patient's Chart, lab work & pertinent test results  History of Anesthesia Complications Negative for: history of anesthetic complications  Airway Mallampati: III  TM Distance: >3 FB Neck ROM: limited    Dental  (+) Chipped, Poor Dentition, Caps   Pulmonary neg pulmonary ROS, neg shortness of breath,           Cardiovascular Exercise Tolerance: Good hypertension, (-) angina(-) Past MI and (-) DOE      Neuro/Psych negative neurological ROS  negative psych ROS   GI/Hepatic negative GI ROS, Neg liver ROS, neg GERD  ,  Endo/Other  negative endocrine ROS  Renal/GU CRFRenal disease  negative genitourinary   Musculoskeletal  (+) Arthritis ,   Abdominal   Peds  Hematology negative hematology ROS (+)   Anesthesia Other Findings Past Medical History: No date: Arthritis     Comment:  knees, back No date: Chronic kidney disease 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 No date: Hypercholesterolemia No date: Hypertension No date: Lumbar herniated disc     Comment:  L4-5, L7-8 No date: Osteopenia No date: Osteopenia No date: Vitamin D deficiency No date: Vitamin D deficiency  Past Surgical History: No date: ABDOMINAL HYSTERECTOMY     Comment:  age 42 07/17/2015: CATARACT EXTRACTION W/PHACO; Left     Comment:  Procedure: CATARACT EXTRACTION PHACO AND INTRAOCULAR               LENS PLACEMENT (IOC);  Surgeon: Leandrew Koyanagi, MD;               Location: [place];  Service: Ophthalmology;                Laterality: Left; 08/28/2015: CATARACT EXTRACTION W/PHACO; Right     Comment:  Procedure: CATARACT EXTRACTION PHACO AND INTRAOCULAR               LENS PLACEMENT (IOC);  Surgeon: Leandrew Koyanagi, MD;               Location: [place];  Service: Ophthalmology;            Laterality: Right; 02/18/2015: COLONOSCOPY WITH PROPOFOL; N/A     Comment:  Procedure: COLONOSCOPY WITH PROPOFOL;  Surgeon: Josefine Class, MD;  Location: [facility] ENDOSCOPY;  Service:               Endoscopy;  Laterality: N/A; No date: FOOT SURGERY     Comment:  Dr Elvina Mattes, [facility] No date: INCISE AND DRAIN ABCESS; Left     Comment:  hand No date: JOINT REPLACEMENT No date: Rt. hip replacement  BMI    Body Mass Index:  26.66 kg/m      Reproductive/Obstetrics negative OB ROS                             Anesthesia Physical Anesthesia Plan  ASA: III  Anesthesia Plan: General   Post-op Pain Management:    Induction: Intravenous  PONV Risk Score and Plan: Propofol infusion and TIVA  Airway Management Planned: Natural Airway and Nasal Cannula  Additional Equipment:   Intra-op Plan:   Post-operative Plan:   Informed Consent: I have reviewed the patients History and Physical, chart, labs and discussed the procedure including the  risks, benefits and alternatives for the proposed anesthesia with the patient or authorized representative who has indicated his/her understanding and acceptance.     Dental Advisory Given  Plan Discussed with: Anesthesiologist, CRNA and Surgeon  Anesthesia Plan Comments: (Patient consented for risks of anesthesia including but not limited to:  - adverse reactions to medications - risk of intubation if required - damage to teeth, lips or other oral mucosa - sore throat or hoarseness - Damage to heart, brain, lungs or loss of life  Patient voiced understanding.)        Anesthesia Quick Evaluation

## 2018-08-01 NOTE — H&P (Signed)
Outpatient short stay form Pre-procedure 08/01/2018 10:12 AM Lollie Sails MD  Primary Physician: Eulogio Bear NP  Reason for visit: Colonoscopy  History of present illness: Patient is a 78 year old female presenting today for colonoscopy in regards to her personal history of adenomatous colon polyps and family history of colon cancer primary relative, mother.  She tolerated her prep well.  She takes no aspirin or blood thinning agent.  Her last colonoscopy was 02/18/2015 with several adenomas removed at that time.   No current facility-administered medications for this encounter.   Medications Prior to Admission  Medication Sig Dispense Refill Last Dose  . atorvastatin (LIPITOR) 40 MG tablet Take 40 mg by mouth daily.   Past Week at Unknown time  . hydrochlorothiazide (HYDRODIURIL) 25 MG tablet Take 25 mg by mouth daily as needed.    Past Week at Unknown time  . traMADol (ULTRAM) 50 MG tablet Take 1 tablet (50 mg total) by mouth every 8 (eight) hours as needed for moderate pain. 25 tablet 0 07/31/2018 at Unknown time  . azithromycin (ZITHROMAX) 250 MG tablet Take daily as directed (Patient not taking: Reported on 08/01/2018) 4 each 0 Not Taking at Unknown time  . cefUROXime (CEFTIN) 500 MG tablet Take 1 tablet (500 mg total) by mouth 2 (two) times daily with a meal. (Patient not taking: Reported on 08/01/2018) 10 tablet 0 Not Taking at Unknown time  . cetirizine (ZYRTEC) 10 MG tablet Take 10 mg by mouth daily as needed for allergies.   PRN at PRN     Allergies  Allergen Reactions  . Ibuprofen   . Ivp Dye [Iodinated Diagnostic Agents] Rash     Past Medical History:  Diagnosis Date  . Arthritis    knees, back  . Chronic kidney disease   . Epitrochlear bursitis   . Hypercholesterolemia   . Hypertension   . 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 Osteopenia   . Osteopenia   . Vitamin D deficiency   . Vitamin D deficiency     Review of systems:      Physical Exam   Heart and lungs: Him without rub or gallop, lungs are bilaterally clear.    HEENT: Normocephalic atraumatic eyes are anicteric    Other:    Pertinant exam for procedure: Soft nontender nondistended bowel sounds positive normoactive    Planned proceedures: Colonoscopy and indicated procedures. I have discussed the risks benefits and complications of procedures to include not limited to bleeding, infection, perforation and the risk of sedation and the patient wishes to proceed.    Lollie Sails, MD Gastroenterology 08/01/2018  10:12 AM

## 2018-08-01 NOTE — Anesthesia Postprocedure Evaluation (Signed)
Anesthesia Post Note  Patient: Sabrina Schmidt  Procedure(s) Performed: COLONOSCOPY WITH PROPOFOL (N/A )  Patient location during evaluation: Endoscopy Anesthesia Type: General Level of consciousness: awake and alert and oriented Pain management: pain level controlled Vital Signs Assessment: post-procedure vital signs reviewed and stable Respiratory status: spontaneous breathing, nonlabored ventilation and respiratory function stable Cardiovascular status: blood pressure returned to baseline and stable Postop Assessment: no signs of nausea or vomiting Anesthetic complications: no     Last Vitals:  Vitals:   08/01/18 1319 08/01/18 1329  BP: 131/81 122/83  Pulse: 86 79  Resp: 20 20  Temp:    SpO2: 100% 98%    Last Pain:  Vitals:   08/01/18 1329  TempSrc:   PainSc: 0-No pain                 Darleth Eustache

## 2018-08-01 NOTE — Anesthesia Post-op Follow-up Note (Signed)
Anesthesia QCDR form completed.        

## 2018-08-01 NOTE — OR Nursing (Signed)
Saline 5 ml injected near hot snare sigmoid colon polyp prior to removal of polyp.

## 2018-08-01 NOTE — Op Note (Addendum)
[facility] Gastroenterology Patient Name: Sabrina Schmidt Procedure Date: 08/01/2018 10:05 AM MRN: [id number] Account #: [id number] Date of Birth: Dec 30, 1940 Admit Type: Outpatient Age: 78 Room: [facility] ENDO ROOM 2 Gender: Female Note Status: Finalized Procedure:            Colonoscopy Indications:          Family history of colon cancer in a first-degree                        relative, Personal history of colonic polyps Providers:            Lollie Sails, MD Medicines:            Monitored Anesthesia Care Complications:        No immediate complications. Procedure:            Pre-Anesthesia Assessment:                       - ASA Grade Assessment: III - A patient with severe                        systemic disease.                       After obtaining informed consent, the colonoscope was                        passed under direct vision. Throughout the procedure,                        the patient's blood pressure, pulse, and oxygen                        saturations were monitored continuously. The                        Colonoscope was introduced through the anus and                        advanced to the the cecum, identified by appendiceal                        orifice and ileocecal valve. The colonoscopy was                        unusually difficult. Successful completion of the                        procedure was aided by changing the patient to a supine                        position, changing the patient to a prone position and                        using manual pressure. The patient tolerated the                        procedure well. The quality of the bowel preparation                        was fair. Findings:  A 3 mm polyp was found at 18 cm proximal to the anus. The polyp was       sessile. The polyp was removed with a cold biopsy forceps. Resection and       retrieval were complete.      Many small and large-mouthed diverticula were  found in the sigmoid       colon, descending colon, transverse colon and ascending colon.      Two sessile polyps were found in the proximal descending colon. The       polyps were 2 to 3 mm in size. These polyps were removed with a cold       biopsy forceps. Resection and retrieval were complete.      A 4 mm polyp was found in the splenic flexure. The polyp was sessile.       The polyp was removed with a cold snare. Resection was complete, but the       polyp tissue was not retrieved.      A 5 mm polyp was found in the transverse colon. The polyp was sessile.       The polyp was removed with a cold snare. Resection and retrieval were       complete.      A 4 mm polyp was found in the cecum. The polyp was sessile. The polyp       was removed with a cold snare. Resection and retrieval were complete.      Four sessile polyps were found in the ascending colon. The polyps were 3       to 5 mm in size. These polyps were removed with a piecemeal technique       using a cold biopsy forceps. Resection and retrieval were complete.      A 3 mm polyp was found in the proximal transverse colon. The polyp was       sessile. The polyp was removed with a cold biopsy forceps. Resection and       retrieval were complete.      A 23 mm polyp was found in the distal sigmoid colon.      the position was extremely difficult to access. A saline lift was       attempted to roll the polyp from behind a fold but was not effective.       The polyp was sessile. Polypectomy was attempted, initially using a hot       snare. Polyp resection was incomplete with this device. This       intervention then required a different device and polypectomy technique.       The polyp was removed with a cold snare. Resection and retrieval were       complete. Area was tattooed with an injection of 3 mL of [place] ink.      The digital rectal exam was normal. Impression:           - Preparation of the colon was fair.                        - One 3 mm polyp at 18 cm proximal to the anus, removed                        with a cold biopsy forceps. Resected and retrieved.                       -  Diverticulosis in the sigmoid colon, in the                        descending colon, in the transverse colon and in the                        ascending colon.                       - Two 2 to 3 mm polyps in the proximal descending                        colon, removed with a cold biopsy forceps. Resected and                        retrieved.                       - One 4 mm polyp at the splenic flexure, removed with a                        cold snare. Complete resection. Polyp tissue not                        retrieved.                       - One 5 mm polyp in the transverse colon, removed with                        a cold snare. Resected and retrieved.                       - One 4 mm polyp in the cecum, removed with a cold                        snare. Resected and retrieved.                       - Four 3 to 5 mm polyps in the ascending colon, removed                        piecemeal using a cold biopsy forceps. Resected and                        retrieved.                       - One 3 mm polyp in the proximal transverse colon,                        removed with a cold biopsy forceps. Resected and                        retrieved.                       - One 23 mm polyp in the distal sigmoid colon, removed                        with a cold snare.  Resected and retrieved. Tattooed. Recommendation:       - Discharge patient to home.                       - Clear liquid diet today.                       - Full liquid diet for 1 day, then advance as tolerated                        to soft diet for 2 days. Procedure Code(s):    --- Professional ---                       [phone], Colonoscopy, flexible; with removal of tumor(s),                        polyp(s), or other lesion(s) by snare technique                       [id number],  85, Colonoscopy, flexible; with biopsy, single                        or multiple                       45381, Colonoscopy, flexible; with directed submucosal                        injection(s), any substance Diagnosis Code(s):    --- Professional ---                       D12.6, Benign neoplasm of colon, unspecified                       D12.0, Benign neoplasm of cecum                       D12.3, Benign neoplasm of transverse colon (hepatic                        flexure or splenic flexure)                       D12.5, Benign neoplasm of sigmoid colon                       D12.4, Benign neoplasm of descending colon                       D12.2, Benign neoplasm of ascending colon                       Z80.0, Family history of malignant neoplasm of                        digestive organs                       Z86.010, Personal history of colonic polyps                       K57.30, Diverticulosis of large intestine without  perforation or abscess without bleeding CPT copyright 2018 American Medical Association. All rights reserved. The codes documented in this report are preliminary and upon coder review may  be revised to meet current compliance requirements. Lollie Sails, MD 08/01/2018 12:58:10 PM This report has been signed electronically. Number of Addenda: 0 Note Initiated On: 08/01/2018 10:05 AM Scope Withdrawal Time: 1 hour 34 minutes 33 seconds  Total Procedure Duration: 2 hours 4 minutes 37 seconds       [facility]

## 2018-08-01 NOTE — Anesthesia Procedure Notes (Signed)
Date/Time: 08/01/2018 10:30 AM Performed by: Allean Found, CRNA Pre-anesthesia Checklist: Patient identified, Emergency Drugs available, Suction available, Patient being monitored and Timeout performed Oxygen Delivery Method: Nasal cannula Placement Confirmation: positive ETCO2

## 2018-08-01 NOTE — OR Nursing (Addendum)
IV in right arm infiltrated, new IV started in right upper extremity by CRNA.

## 2018-08-01 NOTE — Transfer of Care (Signed)
Immediate Anesthesia Transfer of Care Note  Patient: Sabrina Schmidt  Procedure(s) Performed: COLONOSCOPY WITH PROPOFOL (N/A )  Patient Location: PACU  Anesthesia Type:General  Level of Consciousness: sedated  Airway & Oxygen Therapy: Patient Spontanous Breathing and Patient connected to nasal cannula oxygen  Post-op Assessment: Report given to RN and Post -op Vital signs reviewed and stable  Post vital signs: Reviewed and stable  Last Vitals:  Vitals Value Taken Time  BP 142/88 08/01/2018 12:59 PM  Temp 36.3 C 08/01/2018 12:59 PM  Pulse 65 08/01/2018  1:01 PM  Resp 10 08/01/2018  1:01 PM  SpO2 100 % 08/01/2018  1:01 PM  Vitals shown include unvalidated device data.  Last Pain:  Vitals:   08/01/18 1259  TempSrc: Tympanic  PainSc: Asleep         Complications: No apparent anesthesia complications

## 2018-08-02 LAB — SURGICAL PATHOLOGY

## 2020-11-28 ENCOUNTER — Encounter: Payer: Self-pay | Admitting: *Deleted

## 2020-11-29 ENCOUNTER — Ambulatory Visit: Payer: Medicare Other | Admitting: Registered Nurse

## 2020-11-29 ENCOUNTER — Encounter
Admission: RE | Disposition: A | Discharge status: Home / Self Care | Payer: Self-pay | Source: Home / Self Care | Attending: Gastroenterology

## 2020-11-29 ENCOUNTER — Ambulatory Visit
Admission: RE | Admit: 2020-11-29 | Discharge: 2020-11-29 | Disposition: A | Discharge status: Home / Self Care | Payer: Medicare Other | Attending: Gastroenterology | Admitting: Gastroenterology

## 2020-11-29 ENCOUNTER — Encounter: Payer: Self-pay | Admitting: Gastroenterology

## 2020-11-29 DIAGNOSIS — Z79899 Other long term (current) drug therapy: Secondary | ICD-10-CM | POA: Diagnosis not present

## 2020-11-29 DIAGNOSIS — Z1211 Encounter for screening for malignant neoplasm of colon: Secondary | ICD-10-CM | POA: Diagnosis not present

## 2020-11-29 DIAGNOSIS — K5730 Diverticulosis of large intestine without perforation or abscess without bleeding: Secondary | ICD-10-CM | POA: Insufficient documentation

## 2020-11-29 DIAGNOSIS — Z9049 Acquired absence of other specified parts of digestive tract: Secondary | ICD-10-CM | POA: Insufficient documentation

## 2020-11-29 DIAGNOSIS — Z886 Allergy status to analgesic agent status: Secondary | ICD-10-CM | POA: Insufficient documentation

## 2020-11-29 DIAGNOSIS — Z800 Family history of malignant neoplasm of digestive organs: Secondary | ICD-10-CM | POA: Diagnosis not present

## 2020-11-29 DIAGNOSIS — Z90710 Acquired absence of both cervix and uterus: Secondary | ICD-10-CM | POA: Insufficient documentation

## 2020-11-29 DIAGNOSIS — D125 Benign neoplasm of sigmoid colon: Secondary | ICD-10-CM | POA: Insufficient documentation

## 2020-11-29 DIAGNOSIS — Z91041 Radiographic dye allergy status: Secondary | ICD-10-CM | POA: Diagnosis not present

## 2020-11-29 HISTORY — PX: COLONOSCOPY WITH PROPOFOL: SHX5780

## 2020-11-29 SURGERY — COLONOSCOPY WITH PROPOFOL
Anesthesia: General

## 2020-11-29 MED ORDER — SODIUM CHLORIDE 0.9 % IV SOLN
INTRAVENOUS | Status: DC
  Administered 2020-11-29: 20 mL/h via INTRAVENOUS

## 2020-11-29 MED ORDER — LIDOCAINE HCL (CARDIAC) PF 100 MG/5ML IV SOSY
PREFILLED_SYRINGE | INTRAVENOUS | Status: DC | PRN
  Administered 2020-11-29: 40 mg via INTRAVENOUS

## 2020-11-29 MED ORDER — PHENYLEPHRINE HCL (PRESSORS) 10 MG/ML IV SOLN
INTRAVENOUS | Status: AC
  Filled 2020-11-29: qty 1

## 2020-11-29 MED ORDER — PROPOFOL 10 MG/ML IV BOLUS
INTRAVENOUS | Status: AC
  Filled 2020-11-29: qty 20

## 2020-11-29 MED ORDER — PROPOFOL 10 MG/ML IV BOLUS
INTRAVENOUS | Status: DC | PRN
  Administered 2020-11-29: 50 mg via INTRAVENOUS
  Administered 2020-11-29: 10 mg via INTRAVENOUS

## 2020-11-29 MED ORDER — PHENYLEPHRINE HCL (PRESSORS) 10 MG/ML IV SOLN
INTRAVENOUS | Status: DC | PRN
  Administered 2020-11-29: 100 ug via INTRAVENOUS

## 2020-11-29 MED ORDER — PROPOFOL 500 MG/50ML IV EMUL
INTRAVENOUS | Status: DC | PRN
  Administered 2020-11-29: 125 ug/kg/min via INTRAVENOUS

## 2020-11-29 MED ORDER — GLYCOPYRROLATE 0.2 MG/ML IJ SOLN
INTRAMUSCULAR | Status: AC
  Filled 2020-11-29: qty 1

## 2020-11-29 NOTE — H&P (Signed)
Outpatient short stay form Pre-procedure 11/29/2020 12:32 PM Sabrina Miyamoto MD, MPH  Primary Physician: Dr. Edwina Barth  Reason for visit:  Hx of piecemeal resection  History of present illness:   80 y/o lady with history of HLD her for follow-up colonoscopy of piecemeal resection in 2020 of large polyp (per report it was tatoo'ed). History of hysterectomy and cholecystectomy. Mother had colon cancer in her 13's. No blood thinners.    Current Facility-Administered Medications:    0.9 %  sodium chloride infusion, , Intravenous, Continuous, Danely Bayliss, Hilton Cork, MD  Medications Prior to Admission  Medication Sig Dispense Refill Last Dose   albuterol (VENTOLIN HFA) 108 (90 Base) MCG/ACT inhaler Inhale 2 puffs into the lungs every 6 (six) hours as needed for wheezing.   Past Week   atorvastatin (LIPITOR) 40 MG tablet Take 40 mg by mouth daily.   Past Week   calcium elemental as carbonate (BARIATRIC TUMS ULTRA) 400 MG chewable tablet Chew 1,000 mg by mouth as needed for heartburn (heartburn).   Past Week   cetirizine (ZYRTEC) 10 MG tablet Take 10 mg by mouth daily as needed for allergies.   Past Week   docusate sodium (COLACE) 100 MG capsule Take 100 mg by mouth as needed for mild constipation.      gabapentin (NEURONTIN) 300 MG capsule Take 300 mg by mouth 3 (three) times daily.   Past Week   hydrochlorothiazide (HYDRODIURIL) 25 MG tablet Take 25 mg by mouth daily as needed.    Past Week   lisinopril-hydrochlorothiazide (ZESTORETIC) 10-12.5 MG tablet Take 1 tablet by mouth daily.   Past Week   traMADol (ULTRAM) 50 MG tablet Take 1 tablet (50 mg total) by mouth every 8 (eight) hours as needed for moderate pain. 25 tablet 0 Past Week   azithromycin (ZITHROMAX) 250 MG tablet Take daily as directed (Patient not taking: Reported on 08/01/2018) 4 each 0    cefUROXime (CEFTIN) 500 MG tablet Take 1 tablet (500 mg total) by mouth 2 (two) times daily with a meal. (Patient not taking: Reported on 08/01/2018)  10 tablet 0      Allergies  Allergen Reactions   Ibuprofen    Ivp Dye [Iodinated Diagnostic Agents] Rash   Naproxen Rash    syncope     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   Osteopenia    Osteopenia    Vitamin D deficiency    Vitamin D deficiency     Review of systems:  Otherwise negative.    Physical Exam  Gen: Alert, oriented. Appears stated age.  HEENT: PERRLA. Lungs: No respiratory distress CV: RRR Abd: soft, benign, no masses Ext: No edema.    Planned procedures: Proceed with colonoscopy. The patient understands the nature of the planned procedure, indications, risks, alternatives and potential complications including but not limited to bleeding, infection, perforation, damage to internal organs and possible oversedation/side effects from anesthesia. The patient agrees and gives consent to proceed.  Please refer to procedure notes for findings, recommendations and patient disposition/instructions.     Sabrina Miyamoto MD, MPH Gastroenterology 11/29/2020  12:32 PM

## 2020-11-29 NOTE — Interval H&P Note (Signed)
History and Physical Interval Note:  11/29/2020 12:35 PM  Sabrina Schmidt  has presented today for surgery, with the diagnosis of P HX TA POLYPS.  The various methods of treatment have been discussed with the patient and family. After consideration of risks, benefits and other options for treatment, the patient has consented to  Procedure(s): COLONOSCOPY WITH PROPOFOL (N/A) as a surgical intervention.  The patient's history has been reviewed, patient examined, no change in status, stable for surgery.  I have reviewed the patient's chart and labs.  Questions were answered to the patient's satisfaction.     [name]  Ok to proceed with colonoscopy

## 2020-11-29 NOTE — Anesthesia Postprocedure Evaluation (Signed)
Anesthesia Post Note  Patient: Sabrina Schmidt  Procedure(s) Performed: COLONOSCOPY WITH PROPOFOL  Patient location during evaluation: Endoscopy Anesthesia Type: General Level of consciousness: awake and alert Pain management: pain level controlled Vital Signs Assessment: post-procedure vital signs reviewed and stable Respiratory status: spontaneous breathing and respiratory function stable Cardiovascular status: stable Anesthetic complications: no   No notable events documented.   Last Vitals:  Vitals:   11/29/20 1217 11/29/20 1313  BP: 123/72 (!) 100/48  Pulse: (!) 101 79  Resp: 20 18  Temp: 36.6 C (!) 36.1 C  SpO2: 97% 100%    Last Pain:  Vitals:   11/29/20 1313  TempSrc:   PainSc: Asleep                 Paxton Kanaan K

## 2020-11-29 NOTE — Anesthesia Preprocedure Evaluation (Signed)
Anesthesia Evaluation  Patient identified by MRN, date of birth, ID band Patient awake    Reviewed: Allergy & Precautions, NPO status , Patient's Chart, lab work & pertinent test results  History of Anesthesia Complications Negative for: history of anesthetic complications  Airway Mallampati: III       Dental   Dental implants:   Pulmonary neg sleep apnea, neg COPD, Not current smoker,           Cardiovascular hypertension, Pt. on medications (-) Past MI and (-) CHF (-) dysrhythmias (-) Valvular Problems/Murmurs     Neuro/Psych neg Seizures    GI/Hepatic Neg liver ROS, neg GERD  ,  Endo/Other  neg diabetes  Renal/GU Renal InsufficiencyRenal disease     Musculoskeletal   Abdominal   Peds  Hematology   Anesthesia Other Findings   Reproductive/Obstetrics                             Anesthesia Physical Anesthesia Plan  ASA: 2  Anesthesia Plan: General   Post-op Pain Management:    Induction: Intravenous  PONV Risk Score and Plan: 3 and Propofol infusion, TIVA and Treatment may vary due to age or medical condition  Airway Management Planned: Nasal Cannula  Additional Equipment:   Intra-op Plan:   Post-operative Plan:   Informed Consent: I have reviewed the patients History and Physical, chart, labs and discussed the procedure including the risks, benefits and alternatives for the proposed anesthesia with the patient or authorized representative who has indicated his/her understanding and acceptance.       Plan Discussed with:   Anesthesia Plan Comments:         Anesthesia Quick Evaluation

## 2020-11-29 NOTE — Op Note (Signed)
[facility] Patient Name: Sabrina Schmidt Procedure Date: 11/29/2020 12:36 PM MRN: [id number] Account #: [id number] Date of Birth: [birth year] Admit Type: Outpatient Age: 80 Room: [facility] ENDO ROOM 3 Gender: Female Note Status: Finalized Procedure:             Colonoscopy Indications:           Surveillance: History of piecemeal removal adenoma on                         last colonoscopy (< 3 yrs) Providers:             Andrey Farmer MD, MD Referring MD:          Baxter Hire, MD (Referring MD) Medicines:             Monitored Anesthesia Care Complications:         No immediate complications. Estimated blood loss:                         Minimal. Procedure:             Pre-Anesthesia Assessment:                        - Prior to the procedure, a History and Physical was                         performed, and patient medications and allergies were                         reviewed. The patient is competent. The risks and                         benefits of the procedure and the sedation options and                         risks were discussed with the patient. All questions                         were answered and informed consent was obtained.                         Patient identification and proposed procedure were                         verified by the physician, the nurse, the anesthetist                         and the technician in the endoscopy suite. Mental                         Status Examination: alert and oriented. Airway                         Examination: normal oropharyngeal airway and neck                         mobility. Respiratory Examination: clear to  auscultation. CV Examination: normal. Prophylactic                         Antibiotics: The patient does not require prophylactic                         antibiotics. Prior Anticoagulants: The patient has                         taken no previous  anticoagulant or antiplatelet                         agents. ASA Grade Assessment: II - A patient with mild                         systemic disease. After reviewing the risks and                         benefits, the patient was deemed in satisfactory                         condition to undergo the procedure. The anesthesia                         plan was to use monitored anesthesia care (MAC).                         Immediately prior to administration of medications,                         the patient was re-assessed for adequacy to receive                         sedatives. The heart rate, respiratory rate, oxygen                         saturations, blood pressure, adequacy of pulmonary                         ventilation, and response to care were monitored                         throughout the procedure. The physical status of the                         patient was re-assessed after the procedure.                        After obtaining informed consent, the colonoscope was                         passed under direct vision. Throughout the procedure,                         the patient's blood pressure, pulse, and oxygen                         saturations were monitored continuously. The  Colonoscope was introduced through the anus and                         advanced to the the terminal ileum. The patient                         tolerated the procedure well. The colonoscopy was                         somewhat difficult due to a redundant colon. Findings:      The perianal and digital rectal examinations were normal.      The terminal ileum appeared normal.      A tattoo was seen in the sigmoid colon. The tattoo site appeared       abnormal. There appeared to be residual polyp at the tattoo site The       polyp was removed with a cold snare. Resection and retrieval were       complete. Estimated blood loss was minimal.      A few small-mouthed  diverticula were found in the sigmoid colon.      The exam was otherwise without abnormality on direct and retroflexion       views. Impression:            - The examined portion of the ileum was normal.                        - A tattoo was seen in the sigmoid colon. The tattoo                         site appeared abnormal.                        - Diverticulosis in the sigmoid colon.                        - The examination was otherwise normal on direct and                         retroflexion views. Recommendation:        - Discharge patient to home.                        - Resume previous diet.                        - Continue present medications.                        - Await pathology results.                        - Repeat colonoscopy for surveillance based on                         pathology results.                        - Return to referring physician as previously  scheduled. Procedure Code(s):     --- Professional ---                        [phone], Colonoscopy, flexible; with removal of                         tumor(s), polyp(s), or other lesion(s) by snare                         technique Diagnosis Code(s):     --- Professional ---                        Z86.010, Personal history of colonic polyps                        K57.30, Diverticulosis of large intestine without                         perforation or abscess without bleeding CPT copyright 2019 American Medical Association. All rights reserved. The codes documented in this report are preliminary and upon coder review may  be revised to meet current compliance requirements. Andrey Farmer MD, MD 11/29/2020 1:27:36 PM Number of Addenda: 0 Note Initiated On: 11/29/2020 12:36 PM Scope Withdrawal Time: 0 hours 11 minutes 23 seconds  Total Procedure Duration: 0 hours 24 minutes 58 seconds  Estimated Blood Loss:  Estimated blood loss was minimal.      [facility]

## 2020-11-29 NOTE — Transfer of Care (Signed)
Immediate Anesthesia Transfer of Care Note  Patient: Sabrina Schmidt  Procedure(s) Performed: COLONOSCOPY WITH PROPOFOL  Patient Location: PACU  Anesthesia Type:General  Level of Consciousness: drowsy and patient cooperative  Airway & Oxygen Therapy: Patient Spontanous Breathing  Post-op Assessment: Report given to RN and Post -op Vital signs reviewed and stable  Post vital signs: Reviewed and stable  Last Vitals:  Vitals Value Taken Time  BP    Temp    Pulse    Resp    SpO2      Last Pain:  Vitals:   11/29/20 1217  TempSrc: Temporal  PainSc: 0-No pain         Complications: No notable events documented.

## 2020-12-02 LAB — SURGICAL PATHOLOGY

## 2022-01-22 ENCOUNTER — Other Ambulatory Visit: Payer: Self-pay | Admitting: Internal Medicine

## 2022-01-22 DIAGNOSIS — Z1231 Encounter for screening mammogram for malignant neoplasm of breast: Secondary | ICD-10-CM

## 2022-02-24 ENCOUNTER — Ambulatory Visit
Admission: RE | Admit: 2022-02-24 | Discharge: 2022-02-24 | Disposition: A | Discharge status: Home / Self Care | Payer: Medicare Other | Source: Ambulatory Visit | Attending: Internal Medicine | Admitting: Internal Medicine

## 2022-02-24 DIAGNOSIS — Z1231 Encounter for screening mammogram for malignant neoplasm of breast: Secondary | ICD-10-CM | POA: Insufficient documentation

## 2023-01-29 ENCOUNTER — Ambulatory Visit: Admit: 2023-01-29 | Payer: Medicare Other

## 2023-01-29 SURGERY — COLONOSCOPY WITH PROPOFOL
Anesthesia: General

## 2023-11-30 ENCOUNTER — Encounter: Payer: Self-pay | Admitting: *Deleted

## 2023-12-21 ENCOUNTER — Encounter: Admission: RE | Payer: Self-pay | Source: Home / Self Care

## 2023-12-21 ENCOUNTER — Ambulatory Visit: Admission: RE | Admit: 2023-12-21 | Payer: 59 | Source: Home / Self Care

## 2023-12-21 SURGERY — COLONOSCOPY WITH PROPOFOL
Anesthesia: General

## 2024-02-01 ENCOUNTER — Ambulatory Visit

## 2024-02-01 ENCOUNTER — Ambulatory Visit (INDEPENDENT_AMBULATORY_CARE_PROVIDER_SITE_OTHER): Admitting: Cardiovascular Disease

## 2024-02-01 ENCOUNTER — Encounter: Payer: Self-pay | Admitting: Cardiovascular Disease

## 2024-02-01 VITALS — BP 127/75 | HR 88 | Ht <= 58 in | Wt 141.0 lb

## 2024-02-01 DIAGNOSIS — Z1211 Encounter for screening for malignant neoplasm of colon: Secondary | ICD-10-CM | POA: Diagnosis present

## 2024-02-01 DIAGNOSIS — Z860101 Personal history of adenomatous and serrated colon polyps: Secondary | ICD-10-CM | POA: Diagnosis not present

## 2024-02-01 DIAGNOSIS — R9431 Abnormal electrocardiogram [ECG] [EKG]: Secondary | ICD-10-CM

## 2024-02-01 DIAGNOSIS — E782 Mixed hyperlipidemia: Secondary | ICD-10-CM

## 2024-02-01 DIAGNOSIS — I10 Essential (primary) hypertension: Secondary | ICD-10-CM

## 2024-02-01 DIAGNOSIS — Z8701 Personal history of pneumonia (recurrent): Secondary | ICD-10-CM

## 2024-02-01 DIAGNOSIS — D123 Benign neoplasm of transverse colon: Secondary | ICD-10-CM | POA: Diagnosis not present

## 2024-02-01 DIAGNOSIS — J9601 Acute respiratory failure with hypoxia: Secondary | ICD-10-CM | POA: Diagnosis not present

## 2024-02-01 DIAGNOSIS — K5730 Diverticulosis of large intestine without perforation or abscess without bleeding: Secondary | ICD-10-CM | POA: Diagnosis not present

## 2024-02-01 DIAGNOSIS — Z8679 Personal history of other diseases of the circulatory system: Secondary | ICD-10-CM

## 2024-02-01 NOTE — Progress Notes (Signed)
 Cardiology Office Note   Date:  02/01/2024   ID:  Kamaree, Berkel 1941-06-08, MRN [id number]  PCP:  Rudolpho Norleen BIRCH, MD  Cardiologist:  Denyse Bathe, MD      History of Present Illness: Sabrina Schmidt is a 83 y.o. female who presents for  Chief Complaint  Patient presents with   Consult    Afib Consult from [facility]    This is a 83 year old white female who presented to [facility] ambulatory surgery center for colonoscopy and was found to be in atrial fibrillation on the monitor, thus I was asked to evaluate the patient.  Patient apparently on monitor was having A-fib with rapid ventricular response rate.  Anesthesiologist noted it but there is no documented monitor strips that would suggest atrial fibrillation.  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  Patient denies any chest pain shortness of breath or palpitation at this time.  She states that while she is at home she never had any symptoms such as palpitation dizziness or chest pain.  She does have a history of pneumonia and hypoxia and has been on oxygen and sees Dr. Theotis.      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   Osteopenia    Osteopenia    Vitamin D deficiency    Vitamin D deficiency      Past Surgical History:  Procedure Laterality Date   ABDOMINAL HYSTERECTOMY     age 50   APPENDECTOMY     CATARACT EXTRACTION W/PHACO Left 07/17/2015   Procedure: CATARACT EXTRACTION PHACO AND INTRAOCULAR LENS PLACEMENT (IOC);  Surgeon: Dene Etienne, MD;  Location: [facility];  Service: Ophthalmology;  Laterality: Left;   CATARACT EXTRACTION W/PHACO Right 08/28/2015   Procedure: CATARACT EXTRACTION PHACO AND INTRAOCULAR LENS  PLACEMENT (IOC);  Surgeon: Dene Etienne, MD;  Location: [facility];  Service: Ophthalmology;  Laterality: Right;   CHOLECYSTECTOMY     COLONOSCOPY WITH PROPOFOL  N/A 02/18/2015   Procedure: COLONOSCOPY WITH PROPOFOL ;  Surgeon: Donnice Vaughn Manes, MD;  Location: [facility] ENDOSCOPY;  Service: Endoscopy;  Laterality: N/A;   COLONOSCOPY WITH PROPOFOL  N/A 08/01/2018   Procedure: COLONOSCOPY WITH PROPOFOL ;  Surgeon: Gaylyn Gladis PENNER, MD;  Location: [facility] ENDOSCOPY;  Service: Endoscopy;  Laterality: N/A;   COLONOSCOPY WITH PROPOFOL  N/A 11/29/2020   Procedure: COLONOSCOPY WITH PROPOFOL ;  Surgeon: Maryruth Ole DASEN, MD;  Location: [facility] ENDOSCOPY;  Service: Endoscopy;  Laterality: N/A;   FOOT SURGERY     Dr Lilli, [facility]   INCISE AND DRAIN ABCESS Left    hand   JOINT REPLACEMENT     Rt. hip replacement       Current Outpatient Medications  Medication Sig Dispense Refill   atorvastatin  (LIPITOR) 40 MG tablet Take 40 mg by mouth daily.     calcium  elemental as carbonate (BARIATRIC TUMS ULTRA) 400 MG chewable tablet Chew 1,000 mg by mouth as needed for heartburn (heartburn).     gabapentin (NEURONTIN) 300 MG capsule Take 300 mg by mouth 3 (three) times daily.     lisinopril-hydrochlorothiazide (ZESTORETIC) 10-12.5 MG tablet Take 1 tablet by mouth daily.     traMADol  (ULTRAM ) 50 MG tablet Take 1 tablet (50 mg total) by  mouth every 8 (eight) hours as needed for moderate pain. 25 tablet 0   cetirizine (ZYRTEC) 10 MG tablet Take 10 mg by mouth daily as needed for allergies.     No current facility-administered medications for this visit.    Allergies:   Ibuprofen, Shellfish allergy, Ivp dye [iodinated contrast media], and Naproxen    Social History:   reports that she has never smoked. She has never used smokeless tobacco. She reports current alcohol use of about 1.0 standard drink of alcohol per week. She reports that she does not use drugs.   Family History:  family history includes  Alzheimer's disease in her mother and sister; Breast cancer in her maternal grandmother; Breast cancer (age of onset: 45) in her mother.    ROS:     Review of Systems  Constitutional: Negative.   HENT: Negative.    Eyes: Negative.   Respiratory: Negative.    Gastrointestinal: Negative.   Genitourinary: Negative.   Musculoskeletal: Negative.   Skin: Negative.   Neurological: Negative.   Endo/Heme/Allergies: Negative.   Psychiatric/Behavioral: Negative.    All other systems reviewed and are negative.     All other systems are reviewed and negative.    PHYSICAL EXAM: VS:  BP 127/75   Pulse 88   Ht 4' 10 (1.473 m)   Wt 141 lb (64 kg)   SpO2 99%   BMI 29.47 kg/m  , BMI Body mass index is 29.47 kg/m. Last weight:  Wt Readings from Last 3 Encounters:  02/01/24 141 lb (64 kg)  11/29/20 138 lb (62.6 kg)  08/01/18 132 lb (59.9 kg)     Physical Exam Constitutional:      Appearance: Normal appearance.  Cardiovascular:     Rate and Rhythm: Normal rate and regular rhythm.     Heart sounds: Normal heart sounds.  Pulmonary:     Effort: Pulmonary effort is normal.     Breath sounds: Normal breath sounds.  Musculoskeletal:     Right lower leg: No edema.     Left lower leg: No edema.  Neurological:     Mental Status: She is alert.       EKG: NSR 92/min NSIVCD,old ASWMI, lateral MI. EKG in 2011  was normal  Recent Labs: No results found for requested labs within last 365 days.    Lipid Panel No results found for: CHOL, TRIG, HDL, CHOLHDL, VLDL, LDLCALC, LDLDIRECT    Other studies Reviewed: Additional studies/ records that were reviewed today include:  Review of the above records demonstrates:       No data to display            ASSESSMENT AND PLAN:    ICD-10-CM   1. Mixed hyperlipidemia  E78.2 PCV ECHOCARDIOGRAM COMPLETE    MYOCARDIAL PERFUSION IMAGING    CARDIAC EVENT MONITOR    2. Atrial fibrillation, currently in sinus rhythm   Z86.79 PCV ECHOCARDIOGRAM COMPLETE    MYOCARDIAL PERFUSION IMAGING    CARDIAC EVENT MONITOR   No documentation of atrial fibrillation but was seen on the monitor.  Will get a echo and a Holter monitor.  Will start the patient on aspirin 81 mg.    3. Primary hypertension  I10 PCV ECHOCARDIOGRAM COMPLETE    MYOCARDIAL PERFUSION IMAGING    CARDIAC EVENT MONITOR    4. 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  Will get echocardiogram and a nuclear stress test.    5. H/O: pneumonia  Z87.01     6. Acute respiratory failure with hypoxia (HCC)  J96.01        Problem List Items Addressed This Visit   None Visit Diagnoses       Mixed hyperlipidemia    -  Primary   Relevant Orders   PCV ECHOCARDIOGRAM COMPLETE   MYOCARDIAL PERFUSION IMAGING   CARDIAC EVENT MONITOR     Atrial fibrillation, currently in sinus rhythm       No documentation of atrial fibrillation but was seen on the monitor.  Will get a echo and a Holter monitor.  Will start the patient on aspirin 81 mg.   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  Will get echocardiogram and a nuclear stress test.   Relevant Orders   PCV ECHOCARDIOGRAM COMPLETE   MYOCARDIAL PERFUSION IMAGING   CARDIAC EVENT MONITOR     H/O: pneumonia         Acute respiratory failure with hypoxia (HCC)              Disposition:   Return in about 4 weeks (around 02/29/2024) for echo, stress test and holter and f/u.    Total time spent: 50  minutes  Signed,  Denyse Bathe, MD  02/01/2024 10:32 AM    [facility]

## 2024-02-03 ENCOUNTER — Other Ambulatory Visit

## 2024-02-03 DIAGNOSIS — I4892 Unspecified atrial flutter: Secondary | ICD-10-CM | POA: Diagnosis not present

## 2024-02-03 DIAGNOSIS — E782 Mixed hyperlipidemia: Secondary | ICD-10-CM

## 2024-02-03 DIAGNOSIS — I10 Essential (primary) hypertension: Secondary | ICD-10-CM

## 2024-02-03 DIAGNOSIS — R9431 Abnormal electrocardiogram [ECG] [EKG]: Secondary | ICD-10-CM

## 2024-02-03 DIAGNOSIS — Z8679 Personal history of other diseases of the circulatory system: Secondary | ICD-10-CM

## 2024-02-10 ENCOUNTER — Encounter

## 2024-02-18 ENCOUNTER — Ambulatory Visit (INDEPENDENT_AMBULATORY_CARE_PROVIDER_SITE_OTHER)

## 2024-02-18 DIAGNOSIS — I10 Essential (primary) hypertension: Secondary | ICD-10-CM

## 2024-02-18 DIAGNOSIS — R9431 Abnormal electrocardiogram [ECG] [EKG]: Secondary | ICD-10-CM

## 2024-02-18 DIAGNOSIS — I371 Nonrheumatic pulmonary valve insufficiency: Secondary | ICD-10-CM | POA: Diagnosis not present

## 2024-02-18 DIAGNOSIS — I361 Nonrheumatic tricuspid (valve) insufficiency: Secondary | ICD-10-CM

## 2024-02-18 DIAGNOSIS — I340 Nonrheumatic mitral (valve) insufficiency: Secondary | ICD-10-CM | POA: Diagnosis not present

## 2024-02-18 DIAGNOSIS — E782 Mixed hyperlipidemia: Secondary | ICD-10-CM

## 2024-02-18 DIAGNOSIS — Z8679 Personal history of other diseases of the circulatory system: Secondary | ICD-10-CM

## 2024-02-21 ENCOUNTER — Ambulatory Visit

## 2024-02-21 DIAGNOSIS — R9431 Abnormal electrocardiogram [ECG] [EKG]: Secondary | ICD-10-CM

## 2024-02-21 DIAGNOSIS — Z8679 Personal history of other diseases of the circulatory system: Secondary | ICD-10-CM

## 2024-02-21 DIAGNOSIS — I10 Essential (primary) hypertension: Secondary | ICD-10-CM

## 2024-02-21 DIAGNOSIS — E782 Mixed hyperlipidemia: Secondary | ICD-10-CM

## 2024-02-21 MED ORDER — TECHNETIUM TC 99M SESTAMIBI GENERIC - CARDIOLITE
10.8000 | Freq: Once | INTRAVENOUS | Status: AC | PRN
  Administered 2024-02-21: 10.8 via INTRAVENOUS

## 2024-03-02 ENCOUNTER — Encounter: Payer: Self-pay | Admitting: Cardiovascular Disease

## 2024-03-02 ENCOUNTER — Ambulatory Visit (INDEPENDENT_AMBULATORY_CARE_PROVIDER_SITE_OTHER): Admitting: Cardiovascular Disease

## 2024-03-02 VITALS — BP 109/79 | HR 54 | Ht <= 58 in | Wt 136.0 lb

## 2024-03-02 DIAGNOSIS — I4821 Permanent atrial fibrillation: Secondary | ICD-10-CM

## 2024-03-02 DIAGNOSIS — I10 Essential (primary) hypertension: Secondary | ICD-10-CM | POA: Diagnosis not present

## 2024-03-02 DIAGNOSIS — J9601 Acute respiratory failure with hypoxia: Secondary | ICD-10-CM

## 2024-03-02 DIAGNOSIS — E782 Mixed hyperlipidemia: Secondary | ICD-10-CM

## 2024-03-02 DIAGNOSIS — Z8701 Personal history of pneumonia (recurrent): Secondary | ICD-10-CM

## 2024-03-02 DIAGNOSIS — Z8679 Personal history of other diseases of the circulatory system: Secondary | ICD-10-CM

## 2024-03-02 DIAGNOSIS — R9431 Abnormal electrocardiogram [ECG] [EKG]: Secondary | ICD-10-CM | POA: Diagnosis not present

## 2024-03-02 MED ORDER — APIXABAN 2.5 MG PO TABS: 2.5000 mg | ORAL_TABLET | Freq: Two times a day (BID) | ORAL | 3 refills | Status: DC

## 2024-03-02 NOTE — Progress Notes (Addendum)
 Cardiology Office Note   Date:  03/02/2024   ID:  Kairie Vangieson, DOB 05/28/1941, MRN [id number]  PCP:  Rudolpho Norleen BIRCH, MD  Cardiologist:  Denyse Bathe, MD      History of Present Illness: Sabrina Schmidt is a 83 y.o. female who presents for  Chief Complaint  Patient presents with  . Follow-up    4 week follow up - nst, echo, and holter results    Feels good, no palpitation.      Past Medical History:  Diagnosis Date  . Arthritis    knees, back  . Chronic kidney disease   . Epitrochlear bursitis   . Hypercholesterolemia   . Hypertension   . 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 Osteopenia   . Osteopenia   . Vitamin D deficiency   . Vitamin D deficiency      Past Surgical History:  Procedure Laterality Date  . ABDOMINAL HYSTERECTOMY     age 89  . APPENDECTOMY    . CATARACT EXTRACTION W/PHACO Left 07/17/2015   Procedure: CATARACT EXTRACTION PHACO AND INTRAOCULAR LENS PLACEMENT (IOC);  Surgeon: Dene Etienne, MD;  Location: [facility];  Service: Ophthalmology;  Laterality: Left;  . CATARACT EXTRACTION W/PHACO Right 08/28/2015   Procedure: CATARACT EXTRACTION PHACO AND INTRAOCULAR LENS PLACEMENT (IOC);  Surgeon: Dene Etienne, MD;  Location: [facility];  Service: Ophthalmology;  Laterality: Right;  . CHOLECYSTECTOMY    . COLONOSCOPY WITH PROPOFOL  N/A 02/18/2015   Procedure: COLONOSCOPY WITH PROPOFOL ;  Surgeon: Donnice Vaughn Manes, MD;  Location: [facility] ENDOSCOPY;  Service: Endoscopy;  Laterality: N/A;  . COLONOSCOPY WITH PROPOFOL  N/A 08/01/2018   Procedure: COLONOSCOPY WITH PROPOFOL ;  Surgeon: Gaylyn Gladis PENNER, MD;  Location: [facility] ENDOSCOPY;  Service: Endoscopy;  Laterality: N/A;  . COLONOSCOPY WITH PROPOFOL  N/A 11/29/2020   Procedure: COLONOSCOPY WITH PROPOFOL ;  Surgeon: Maryruth Ole DASEN, MD;  Location: [facility] ENDOSCOPY;  Service: Endoscopy;  Laterality: N/A;  . FOOT SURGERY     Dr Lilli, [facility]  . INCISE AND  DRAIN ABCESS Left    hand  . JOINT REPLACEMENT    . Rt. hip replacement       Current Outpatient Medications  Medication Sig Dispense Refill  . apixaban  (ELIQUIS ) 2.5 MG TABS tablet Take 1 tablet (2.5 mg total) by mouth 2 (two) times daily. 60 tablet 3  . atorvastatin  (LIPITOR) 40 MG tablet Take 40 mg by mouth daily.    . calcium  elemental as carbonate (BARIATRIC TUMS ULTRA) 400 MG chewable tablet Chew 1,000 mg by mouth as needed for heartburn (heartburn).    . cetirizine (ZYRTEC) 10 MG tablet Take 10 mg by mouth daily as needed for allergies.    SABRA gabapentin (NEURONTIN) 300 MG capsule Take 300 mg by mouth 3 (three) times daily.    SABRA lisinopril-hydrochlorothiazide (ZESTORETIC) 10-12.5 MG tablet Take 1 tablet by mouth daily.    . traMADol  (ULTRAM ) 50 MG tablet Take 1 tablet (50 mg total) by mouth every 8 (eight) hours as needed for moderate pain. 25 tablet 0   No current facility-administered medications for this visit.    Allergies:   Ibuprofen, Shellfish allergy, Ivp dye [iodinated contrast media], and Naproxen    Social History:   reports that she has never smoked. She has never used smokeless tobacco. She reports current alcohol use of about 1.0 standard drink of alcohol per week. She reports that she does not use drugs.   Family History:  family  history includes Alzheimer's disease in her mother and sister; Breast cancer in her maternal grandmother; Breast cancer (age of onset: 26) in her mother.    ROS:     Review of Systems  Constitutional: Negative.   HENT: Negative.    Eyes: Negative.   Respiratory: Negative.    Gastrointestinal: Negative.   Genitourinary: Negative.   Musculoskeletal: Negative.   Skin: Negative.   Neurological: Negative.   Endo/Heme/Allergies: Negative.   Psychiatric/Behavioral: Negative.    All other systems reviewed and are negative.     All other systems are reviewed and negative.    PHYSICAL EXAM: VS:  BP 109/79   Pulse (!) 54   Ht 4'  10 (1.473 m)   Wt 136 lb (61.7 kg)   SpO2 91%   BMI 28.42 kg/m  , BMI Body mass index is 28.42 kg/m. Last weight:  Wt Readings from Last 3 Encounters:  03/02/24 136 lb (61.7 kg)  02/01/24 141 lb (64 kg)  11/29/20 138 lb (62.6 kg)     Physical Exam Constitutional:      Appearance: Normal appearance.  Cardiovascular:     Rate and Rhythm: Normal rate and regular rhythm.     Heart sounds: Normal heart sounds.  Pulmonary:     Effort: Pulmonary effort is normal.     Breath sounds: Normal breath sounds.  Musculoskeletal:     Right lower leg: No edema.     Left lower leg: No edema.  Neurological:     Mental Status: She is alert.       EKG:   Recent Labs: No results found for requested labs within last 365 days.    Lipid Panel No results found for: CHOL, TRIG, HDL, CHOLHDL, VLDL, LDLCALC, LDLDIRECT    Other studies Reviewed: Additional studies/ records that were reviewed today include:  Review of the above records demonstrates:       No data to display            ASSESSMENT AND PLAN:    ICD-10-CM   1. Mixed hyperlipidemia  E78.2 apixaban  (ELIQUIS ) 2.5 MG TABS tablet    2. Primary hypertension  I10 apixaban  (ELIQUIS ) 2.5 MG TABS tablet    3. Acute respiratory failure with hypoxia (HCC)  J96.01 apixaban  (ELIQUIS ) 2.5 MG TABS tablet    4. H/O: pneumonia  Z87.01 apixaban  (ELIQUIS ) 2.5 MG TABS tablet    5. Abnormal EKG  R94.31 apixaban  (ELIQUIS ) 2.5 MG TABS tablet   septal defect  due to breart attenuation with normal EF, ECHO had grade 1 diastolic dysfunction, mild MR. Afib, with slow Rate. start elliquis.    6. Atrial fibrillation, currently in sinus rhythm  Z86.79     7. Permanent atrial fibrillation (HCC)  I48.21    Afib with slow VR, probably chronic, Has CRI, and over 80, thus start elliquis 2.5 bid       Problem List Items Addressed This Visit   None Visit Diagnoses       Mixed hyperlipidemia    -  Primary   Relevant Medications    apixaban  (ELIQUIS ) 2.5 MG TABS tablet     Primary hypertension       Relevant Medications   apixaban  (ELIQUIS ) 2.5 MG TABS tablet     Acute respiratory failure with hypoxia (HCC)       Relevant Medications   apixaban  (ELIQUIS ) 2.5 MG TABS tablet     H/O: pneumonia       Relevant Medications   apixaban  (  ELIQUIS ) 2.5 MG TABS tablet     Abnormal EKG       septal defect  due to breart attenuation with normal EF, ECHO had grade 1 diastolic dysfunction, mild MR. Afib, with slow Rate. start elliquis.   Relevant Medications   apixaban  (ELIQUIS ) 2.5 MG TABS tablet     Atrial fibrillation, currently in sinus rhythm         Permanent atrial fibrillation (HCC)       Afib with slow VR, probably chronic, Has CRI, and over 80, thus start elliquis 2.5 bid   Relevant Medications   apixaban  (ELIQUIS ) 2.5 MG TABS tablet          Disposition:   Return in about 1 month (around 04/01/2024).    Total time spent: 35 minutes  Signed,  Denyse Bathe, MD  03/02/2024 11:03 AM    [facility]

## 2024-03-02 NOTE — Addendum Note (Signed)
 Addended by: FERNAND ALTER A on: 03/02/2024 11:02 AM   Modules accepted: Orders

## 2024-03-07 ENCOUNTER — Other Ambulatory Visit: Payer: Self-pay

## 2024-03-07 ENCOUNTER — Telehealth: Payer: Self-pay

## 2024-03-07 MED ORDER — RIVAROXABAN 15 MG PO TABS: 15.0000 mg | ORAL_TABLET | Freq: Every day | ORAL | 3 refills | Status: AC

## 2024-03-07 NOTE — Telephone Encounter (Signed)
 Medlist updated and rx sent in for xarelto

## 2024-03-07 NOTE — Telephone Encounter (Signed)
 Pt came by stating her rx eliquis  2.5mg  $302 for a 30 day supply, she said she cannot afford that. Asked if rx can be switched to plavix or cheaper alternate? Please advise

## 2024-04-03 ENCOUNTER — Ambulatory Visit: Admitting: Cardiovascular Disease

## 2024-04-03 ENCOUNTER — Encounter: Payer: Self-pay | Admitting: Cardiovascular Disease

## 2024-04-03 VITALS — BP 110/70 | HR 96 | Ht 61.0 in | Wt 140.1 lb

## 2024-04-03 DIAGNOSIS — I10 Essential (primary) hypertension: Secondary | ICD-10-CM

## 2024-04-03 DIAGNOSIS — Z8679 Personal history of other diseases of the circulatory system: Secondary | ICD-10-CM

## 2024-04-03 DIAGNOSIS — E782 Mixed hyperlipidemia: Secondary | ICD-10-CM | POA: Diagnosis not present

## 2024-04-03 DIAGNOSIS — I340 Nonrheumatic mitral (valve) insufficiency: Secondary | ICD-10-CM

## 2024-04-03 NOTE — Progress Notes (Signed)
 Cardiology Office Note   Date:  04/03/2024   ID:  Melyna Huron, DOB [birth year], MRN [id number]  PCP:  Rudolpho Norleen BIRCH, MD  Cardiologist:  Denyse Bathe, MD      History of Present Illness: Sabrina Schmidt is a 83 y.o. female who presents for  Chief Complaint  Patient presents with   Follow-up    1 month follow up    No chest pain or Palpitation.      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   Osteopenia    Osteopenia    Vitamin D deficiency    Vitamin D deficiency      Past Surgical History:  Procedure Laterality Date   ABDOMINAL HYSTERECTOMY     age 70   APPENDECTOMY     CATARACT EXTRACTION W/PHACO Left 07/17/2015   Procedure: CATARACT EXTRACTION PHACO AND INTRAOCULAR LENS PLACEMENT (IOC);  Surgeon: Dene Etienne, MD;  Location: [facility];  Service: Ophthalmology;  Laterality: Left;   CATARACT EXTRACTION W/PHACO Right 08/28/2015   Procedure: CATARACT EXTRACTION PHACO AND INTRAOCULAR LENS PLACEMENT (IOC);  Surgeon: Dene Etienne, MD;  Location: [facility];  Service: Ophthalmology;  Laterality: Right;   CHOLECYSTECTOMY     COLONOSCOPY WITH PROPOFOL  N/A 02/18/2015   Procedure: COLONOSCOPY WITH PROPOFOL ;  Surgeon: Donnice Vaughn Manes, MD;  Location: [facility] END[facility]COPY;  Service: Endoscopy;  Laterality: N/A;   COLONOSCOPY WITH PROPOFOL  N/A 08/01/2018   Procedure: COLONOSCOPY WITH PROPOFOL ;  Surgeon: Gaylyn Gladis PENNER, MD;  Location: [facility] END[facility];  Service: Endoscopy;  Laterality: N/A;   COLONOSCOPY WITH PROPOFOL  N/A 11/29/2020   Procedure: COLONOSCOPY WITH PROPOFOL ;  Surgeon: Maryruth Ole DASEN, MD;  Location: [facility] ENDOSCOPY;  Service: Endoscopy;  Laterality: N/A;   FOOT SURGERY     Dr Lilli, [facility]   INCISE AND DRAIN ABCESS Left    hand   JOINT REPLACEMENT      Rt. hip replacement       Current Outpatient Medications  Medication Sig Dispense Refill   atorvastatin  (LIPITOR) 40 MG tablet Take 40 mg by mouth daily.     gabapentin (NEURONTIN) 300 MG capsule Take 300 mg by mouth 3 (three) times daily. (Patient taking differently: Take 300 mg by mouth as needed.)     lisinopril-hydrochlorothiazide (ZESTORETIC) 10-12.5 MG tablet Take 1 tablet by mouth daily.     Rivaroxaban (XARELTO) 15 MG TABS tablet Take 1 tablet (15 mg total) by mouth daily with supper. 30 tablet 3   traMADol  (ULTRAM ) 50 MG tablet Take 1 tablet (50 mg total) by mouth every 8 (eight) hours as needed for moderate pain. 25 tablet 0   No current facility-administered medications for this visit.    Allergies:   Ibuprofen, Shellfish allergy, Ivp dye [iodinated contrast media], and Naproxen    Social History:   reports that she has never smoked. She has never used smokeless tobacco. She reports current alcohol use of about 1.0 standard drink of alcohol per week. She reports that she does not use drugs.   Family History:  family history includes Alzheimer's disease in her mother and sister; Breast cancer in her maternal grandmother; Breast cancer (age of onset: 31) in her mother.    ROS:     Review of Systems  Constitutional: Negative.  HENT: Negative.    Eyes: Negative.   Respiratory: Negative.    Gastrointestinal: Negative.   Genitourinary: Negative.   Musculoskeletal: Negative.   Skin: Negative.   Neurological: Negative.   Endo/Heme/Allergies: Negative.   Psychiatric/Behavioral: Negative.    All other systems reviewed and are negative.     All other systems are reviewed and negative.    PHYSICAL EXAM: VS:  BP 110/70   Pulse 96   Ht 5' 1 (1.549 m)   Wt 140 lb 1.6 oz (63.5 kg)   SpO2 97%   BMI 26.47 kg/m  , BMI Body mass index is 26.47 kg/m. Last weight:  Wt Readings from Last 3 Encounters:  04/03/24 140 lb 1.6 oz (63.5 kg)  03/02/24 136 lb (61.7 kg)  02/01/24  141 lb (64 kg)     Physical Exam Constitutional:      Appearance: Normal appearance.  Cardiovascular:     Rate and Rhythm: Normal rate and regular rhythm.     Heart sounds: Normal heart sounds.  Pulmonary:     Effort: Pulmonary effort is normal.     Breath sounds: Normal breath sounds.  Musculoskeletal:     Right lower leg: No edema.     Left lower leg: No edema.  Neurological:     Mental Status: She is alert.       EKG:   Recent Labs: No results found for requested labs within last 365 days.    Lipid Panel No results found for: CHOL, TRIG, HDL, CHOLHDL, VLDL, LDLCALC, LDLDIRECT    Other studies Reviewed: Additional studies/ records that were reviewed today include:  Review of the above records demonstrates:       No data to display            ASSESSMENT AND PLAN:    ICD-10-CM   1. Mixed hyperlipidemia  E78.2     2. Primary hypertension  I10     3. Atrial fibrillation, currently in sinus rhythm  Z86.79    Feeling better. In NSR.    4. Nonrheumatic mitral valve regurgitation  I34.0    trace MR, LVEF normal.       Problem List Items Addressed This Visit   None Visit Diagnoses       Mixed hyperlipidemia    -  Primary     Primary hypertension         Atrial fibrillation, currently in sinus rhythm       Feeling better. In NSR.     Nonrheumatic mitral valve regurgitation       trace MR, LVEF normal.          Disposition:   Return in about 3 months (around 07/04/2024).    Total time spent: 35 minutes  Signed,  Denyse Bathe, MD  04/03/2024 9:31 AM    [facility]

## 2024-07-04 ENCOUNTER — Ambulatory Visit: Admitting: Cardiovascular Disease

## 2024-07-11 ENCOUNTER — Ambulatory Visit: Admitting: Cardiovascular Disease

## 2024-08-03 ENCOUNTER — Ambulatory Visit: Admitting: Cardiovascular Disease
